# Patient Record
Sex: Female | Born: 2000 | Race: White | Hispanic: No | Marital: Single | Smoking: Former smoker
Health system: Southern US, Community
[De-identification: ages and names within clinical notes are randomized; demographics above are authoritative.]

## PROBLEM LIST (undated history)

## (undated) DIAGNOSIS — T1491XA Suicide attempt, initial encounter: Secondary | ICD-10-CM

## (undated) DIAGNOSIS — E119 Type 2 diabetes mellitus without complications: Secondary | ICD-10-CM

## (undated) DIAGNOSIS — R079 Chest pain, unspecified: Secondary | ICD-10-CM

## (undated) DIAGNOSIS — F419 Anxiety disorder, unspecified: Secondary | ICD-10-CM

## (undated) DIAGNOSIS — F32A Depression, unspecified: Secondary | ICD-10-CM

## (undated) DIAGNOSIS — R000 Tachycardia, unspecified: Secondary | ICD-10-CM

## (undated) DIAGNOSIS — F909 Attention-deficit hyperactivity disorder, unspecified type: Secondary | ICD-10-CM

## (undated) DIAGNOSIS — J45909 Unspecified asthma, uncomplicated: Secondary | ICD-10-CM

## (undated) DIAGNOSIS — I10 Essential (primary) hypertension: Secondary | ICD-10-CM

## (undated) DIAGNOSIS — Z6841 Body Mass Index (BMI) 40.0 and over, adult: Secondary | ICD-10-CM

## (undated) DIAGNOSIS — R071 Chest pain on breathing: Secondary | ICD-10-CM

## (undated) DIAGNOSIS — L732 Hidradenitis suppurativa: Secondary | ICD-10-CM

## (undated) DIAGNOSIS — F329 Major depressive disorder, single episode, unspecified: Secondary | ICD-10-CM

## (undated) DIAGNOSIS — J309 Allergic rhinitis, unspecified: Secondary | ICD-10-CM

## (undated) HISTORY — DX: Anxiety disorder, unspecified: F41.9

## (undated) HISTORY — DX: Body Mass Index (BMI) 40.0 and over, adult: Z684

## (undated) HISTORY — DX: Chest pain on breathing: R07.1

## (undated) HISTORY — PX: TONSILLECTOMY: SUR1361

## (undated) HISTORY — DX: Chest pain, unspecified: R07.9

## (undated) HISTORY — DX: Essential (primary) hypertension: I10

## (undated) HISTORY — DX: Allergic rhinitis, unspecified: J30.9

## (undated) HISTORY — DX: Tachycardia, unspecified: R00.0

---
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
## 2021-03-06 NOTE — Progress Notes (Signed)
This encounter was created in error - please disregard.

## 2021-03-12 ENCOUNTER — Encounter: Payer: Self-pay | Admitting: Emergency Medicine

## 2021-03-12 ENCOUNTER — Ambulatory Visit
Admission: EM | Admit: 2021-03-12 | Discharge: 2021-03-12 | Disposition: A | Discharge status: Home / Self Care | Payer: Medicaid Other | Attending: Internal Medicine | Admitting: Internal Medicine

## 2021-03-12 DIAGNOSIS — L739 Follicular disorder, unspecified: Secondary | ICD-10-CM | POA: Diagnosis not present

## 2021-03-12 MED ORDER — DOXYCYCLINE HYCLATE 100 MG PO CAPS: 100.0000 mg | ORAL_CAPSULE | Freq: Two times a day (BID) | ORAL | 0 refills | Status: DC

## 2021-03-12 NOTE — ED Provider Notes (Signed)
[facility]    CSN: [id number] Arrival date & time: 03/12/21  1444      History   Chief Complaint Chief Complaint  Patient presents with   Facial Bump    HPI Chelsea Wade is a 20 y.o. female.   Patient presents with bump to left side of face near her mouth that has been present for 3 to 4 days.  Patient has tried to "pop" bump with no access.  Patient denies any drainage from the bump.  Denies any fevers.  Denies any insects biting face.  Patient reports that this has not happened to face before but she does have similar lesions that are present throughout her body at times.    Past Medical History:  Diagnosis Date   ADHD (attention deficit hyperactivity disorder)    Asthma    Depression    Suicide attempt [facility]     Patient Active Problem List   Diagnosis Date Noted   MDD (major depressive disorder), severe (HCC) 09/30/2017   MDD (major depressive disorder), recurrent episode, severe (HCC) 11/12/2015   Drug overdose 11/10/2015   Intentional overdose of drug in tablet form (HCC) 11/10/2015   Convulsions/seizures (HCC) 11/10/2015    Past Surgical History:  Procedure Laterality Date   TONSILLECTOMY      OB History   No obstetric history on file.      Home Medications    Prior to Admission medications   Medication Sig Start Date End Date Taking? Authorizing Provider  albuterol (PROVENTIL HFA;VENTOLIN HFA) 108 (90 Base) MCG/ACT inhaler Inhale 2 puffs into the lungs every 4 (four) hours as needed for wheezing or shortness of breath.   Yes [provider]  cetirizine (ZYRTEC) 10 MG chewable tablet Chew 1 tablet (10 mg total) by mouth daily. 12/16/18  Yes Wurst, [place], PA-C  cloNIDine (CATAPRES) 0.2 MG tablet Take 1 tablet (0.2 mg total) by mouth 2 (two) times daily. 12/06/20  Yes Myrlene Broker, MD  Dexmethylphenidate HCl (FOCALIN XR) 30 MG CP24 Take 1 capsule (30 mg total) by mouth in the morning. 12/06/20  Yes Myrlene Broker, MD   Dexmethylphenidate HCl (FOCALIN XR) 30 MG CP24 Take 1 capsule (30 mg total) by mouth every morning. 12/06/20  Yes Myrlene Broker, MD  Dexmethylphenidate HCl (FOCALIN XR) 30 MG CP24 Take 1 capsule (30 mg total) by mouth every morning. 12/06/20  Yes Myrlene Broker, MD  doxycycline (VIBRAMYCIN) 100 MG capsule Take 1 capsule (100 mg total) by mouth 2 (two) times daily. 03/12/21  Yes Lance Muss, FNP  FLUoxetine (PROZAC) 20 MG capsule Take 1 capsule (20 mg total) by mouth daily. 12/06/20 12/06/21 Yes Myrlene Broker, MD  fluticasone [facility]) 50 MCG/ACT nasal spray Place 2 sprays into both nostrils daily. 12/16/18  Yes Wurst, [place], PA-C  hydrOXYzine (VISTARIL) 25 MG capsule Take 1 capsule (25 mg total) by mouth daily as needed for anxiety. 05/12/20  Yes Hisada, Barbee Cough, MD  norethindrone (MICRONOR) 0.35 MG tablet Take by mouth. 07/04/20  Yes [provider]  ondansetron (ZOFRAN ODT) 4 MG disintegrating tablet Take 1 tablet (4 mg total) by mouth every 8 (eight) hours as needed for nausea or vomiting. 12/11/20  Yes Lamptey, Britta Mccreedy, MD  FLOVENT HFA 110 MCG/ACT inhaler INHALE 2 PUFFS TWICE DAILY FOR CONTROL OF COUGH 08/23/17   [provider]  predniSONE (DELTASONE) 20 MG tablet Take 2 tablets (40 mg total) by mouth daily. 12/20/20   Mardella Layman, MD  promethazine-dextromethorphan (  PROMETHAZINE-DM) 6.25-15 MG/5ML syrup Take 5 mLs by mouth 4 (four) times daily as needed for cough. 12/11/20   Lamptey, Britta Mccreedy, MD    Family History Family History  Problem Relation Age of Onset   Depression Mother    Bipolar disorder Father    Mental illness Father    Alcohol abuse Father    Schizophrenia Maternal Grandfather    Alcohol abuse Maternal Grandfather     Social History Social History   Tobacco Use   Smoking status: Every Day    Packs/day: 0.25    Types: Cigarettes   Smokeless tobacco: Never  Vaping Use   Vaping Use: Every day  Substance Use Topics   Alcohol use: Yes     Alcohol/week: 3.0 standard drinks    Types: 3 Shots of liquor per week    Comment: occ   Drug use: Yes    Frequency: 3.0 times per week    Types: Marijuana     Allergies   Patient has no known allergies.   Review of Systems Review of Systems Per HPI  Physical Exam Triage Vital Signs ED Triage Vitals  Enc Vitals Group     BP 03/12/21 1454 132/86     Pulse Rate 03/12/21 1454 (!) 105     Resp 03/12/21 1454 20     Temp 03/12/21 1454 98.2 F (36.8 C)     Temp Source 03/12/21 1454 Oral     SpO2 03/12/21 1454 99 %     Weight 03/12/21 1456 250 lb (113.4 kg)     Height 03/12/21 1456 5\' 3"  (1.6 m)     Head Circumference --      Peak Flow --      Pain Score 03/12/21 1456 8     Pain Loc --      Pain Edu? --      Excl. in GC? --    No data found.  Updated Vital Signs BP 132/86 (BP Location: Left Arm)   Pulse (!) 105   Temp 98.2 F (36.8 C) (Oral)   Resp 20   Ht 5\' 3"  (1.6 m)   Wt 250 lb (113.4 kg)   SpO2 99%   BMI 44.29 kg/m   Visual Acuity Right Eye Distance:   Left Eye Distance:   Bilateral Distance:    Right Eye Near:   Left Eye Near:    Bilateral Near:     Physical Exam Constitutional:      Appearance: Normal appearance.  HENT:     Head: Normocephalic and atraumatic.     Mouth/Throat:     Mouth: Mucous membranes are moist. No oral lesions.     Dentition: Normal dentition. No dental tenderness, gingival swelling or dental abscesses.  Eyes:     Extraocular Movements: Extraocular movements intact.     Conjunctiva/sclera: Conjunctivae normal.  Pulmonary:     Effort: Pulmonary effort is normal.  Skin:    General: Skin is warm and dry.     Findings: Abscess present.     Comments: Approximately 1.5 mm indurated abscess present directly above left upper lip.  Induration spreads approximately 1 mm to each side of abscess.  No drainage noted on exam.  Oral mucosa normal.  Neurological:     General: No focal deficit present.     Mental Status: She is alert  and oriented to person, place, and time. Mental status is at baseline.  Psychiatric:        Mood and Affect: Mood  normal.        Behavior: Behavior normal.        Thought Content: Thought content normal.        Judgment: Judgment normal.     UC Treatments / Results  Labs (all labs ordered are listed, but only abnormal results are displayed) Labs Reviewed - No data to display  EKG   Radiology No results found.  Procedures Procedures (including critical care time)  Medications Ordered in UC Medications - No data to display  Initial Impression / Assessment and Plan / UC Course  I have reviewed the triage vital signs and the nursing notes.  Pertinent labs & imaging results that were available during my care of the patient were reviewed by me and considered in my medical decision making (see chart for details).     Will treat facial lesion with doxycycline x10 days.  Lesion appears to be folliculitis of upper lip.  No I&D needed at this time.  Advised patient to use warm compresses.  Patient to follow-up if no improvement or if lesion worsens.Discussed strict return precautions. Patient verbalized understanding and is agreeable with plan.  Final Clinical Impressions(s) / UC Diagnoses   Final diagnoses:  Folliculitis     Discharge Instructions      You have been prescribed doxycycline antibiotic to help treat bump on face.  Please also use warm compresses.  Follow-up with primary care physician if it does not resolve.     ED Prescriptions     Medication Sig Dispense Auth. Provider   doxycycline (VIBRAMYCIN) 100 MG capsule Take 1 capsule (100 mg total) by mouth 2 (two) times daily. 20 capsule Lance Muss, FNP      PDMP not reviewed this encounter.   Lance Muss, FNP 03/12/21 [phone]

## 2021-03-12 NOTE — ED Triage Notes (Signed)
Patient c/o a bump on left side of face for 3-4 days.  The bump is near patient's lip.  Patient has tried to "pop" the bump which has irritated the bump more.

## 2021-03-12 NOTE — Discharge Instructions (Signed)
You have been prescribed doxycycline antibiotic to help treat bump on face.  Please also use warm compresses.  Follow-up with primary care physician if it does not resolve.

## 2021-03-21 ENCOUNTER — Other Ambulatory Visit: Payer: Self-pay

## 2021-03-21 ENCOUNTER — Encounter (HOSPITAL_COMMUNITY): Payer: Self-pay | Admitting: Psychiatry

## 2021-03-21 ENCOUNTER — Telehealth (INDEPENDENT_AMBULATORY_CARE_PROVIDER_SITE_OTHER): Payer: Medicaid Other | Admitting: Psychiatry

## 2021-03-21 DIAGNOSIS — F331 Major depressive disorder, recurrent, moderate: Secondary | ICD-10-CM

## 2021-03-21 MED ORDER — CLONAZEPAM 0.5 MG PO TABS: 0.5000 mg | ORAL_TABLET | Freq: Every day | ORAL | 2 refills | Status: DC | PRN

## 2021-03-21 MED ORDER — FLUOXETINE HCL 20 MG PO CAPS: 20.0000 mg | ORAL_CAPSULE | Freq: Every day | ORAL | 2 refills | Status: DC

## 2021-03-21 NOTE — Progress Notes (Signed)
Virtual Visit via Telephone Note  I connected with Chelsea Wade on 03/21/21 at 10:40 AM EDT by telephone and verified that I am speaking with the correct person using two identifiers.  Location: Patient: home Provider: home office   I discussed the limitations, risks, security and privacy concerns of performing an evaluation and management service by telephone and the availability of in person appointments. I also discussed with the patient that there may be a patient responsible charge related to this service. The patient expressed understanding and agreed to proceed.      I discussed the assessment and treatment plan with the patient. The patient was provided an opportunity to ask questions and all were answered. The patient agreed with the plan and demonstrated an understanding of the instructions.   The patient was advised to call back or seek an in-person evaluation if the symptoms worsen or if the condition fails to improve as anticipated.  I provided  minutes of non-face-to-face time during this encounter.   Diannia Ruder, MD  [facility] MD/PA/NP OP Progress Note  03/21/2021 11:03 AM Chelsea Wade  MRN:  [id number]  Chief Complaint:  Chief Complaint   Anxiety; Depression; ADD; Follow-up    HPI: This patient is a 20 year old white female who lives with her boyfriend and boyfriend's mother in [place].  She is currently working in a daycare center.  The patient returns for follow-up after 3 months regarding her depression anxiety and ADHD.  She states that she was off her medications for about a month because she "could not find them."  She just got back on the fluoxetine last week.  She no longer feels that she needs medicines for ADHD or sleep.  She does state that her anxiety has gotten much worse lately.  Her job can be very stressful and she is constantly worried about the children getting hurt that are in her care.  Also her mother is having major surgery as a complication  of Crohn's disease.  She has to have her entire stomach removed next week.  The patient is very worried about this and thinks she exacerbated her mom's illness by moving out last year.  I reassured her that Crohn's can be a very bad disease and this certainly was not influenced by her move.  The patient denies being suicidal or significantly depressed.  I suggested we add a low-dose of clonazepam given her anxiety and that she also get into therapy and she agrees Visit Diagnosis:    ICD-10-CM   1. Moderate episode of recurrent major depressive disorder (HCC)  F33.1       Past Psychiatric History: The patient has had 2 previous psychiatric hospitalizations as well as medication trials of Prozac Lexapro Strattera and Focalin XR  Past Medical History:  Past Medical History:  Diagnosis Date   ADHD (attention deficit hyperactivity disorder)    Asthma    Depression    Suicide attempt [facility])     Past Surgical History:  Procedure Laterality Date   TONSILLECTOMY      Family Psychiatric History: See below  Family History:  Family History  Problem Relation Age of Onset   Depression Mother    Bipolar disorder Father    Mental illness Father    Alcohol abuse Father    Schizophrenia Maternal Grandfather    Alcohol abuse Maternal Grandfather     Social History:  Social History   Socioeconomic History   Marital status: Single    Spouse name: Not on file  Number of children: Not on file   Years of education: Not on file   Highest education level: Not on file  Occupational History   Not on file  Tobacco Use   Smoking status: Every Day    Packs/day: 0.25    Types: Cigarettes   Smokeless tobacco: Never  Vaping Use   Vaping Use: Every day  Substance and Sexual Activity   Alcohol use: Yes    Alcohol/week: 3.0 standard drinks    Types: 3 Shots of liquor per week    Comment: occ   Drug use: Yes    Frequency: 3.0 times per week    Types: Marijuana   Sexual activity: Not  Currently    Birth control/protection: None  Other Topics Concern   Not on file  Social History Narrative   Lives with mom, step-dad, brother. Step brothers every other weekend.   Social Determinants of Health   Financial Resource Strain: Not on file  Food Insecurity: Not on file  Transportation Needs: Not on file  Physical Activity: Not on file  Stress: Not on file  Social Connections: Not on file    Allergies: No Known Allergies  Metabolic Disorder Labs: Lab Results  Component Value Date   HGBA1C 5.2 10/02/2017   MPG 102.54 10/02/2017   MPG 108 11/14/2015   Lab Results  Component Value Date   PROLACTIN 57.7 (H) 10/02/2017   Lab Results  Component Value Date   CHOL 145 10/02/2017   TRIG 89 10/02/2017   HDL 39 (L) 10/02/2017   CHOLHDL 3.7 10/02/2017   VLDL 18 10/02/2017   LDLCALC 88 10/02/2017   LDLCALC 53 11/14/2015   Lab Results  Component Value Date   TSH 4.562 10/02/2017   TSH 3.926 11/14/2015    Therapeutic Level Labs: No results found for: LITHIUM No results found for: VALPROATE No components found for:  CBMZ  Current Medications: Current Outpatient Medications  Medication Sig Dispense Refill   clonazePAM (KLONOPIN) 0.5 MG tablet Take 1 tablet (0.5 mg total) by mouth daily as needed for anxiety. 30 tablet 2   doxycycline (VIBRAMYCIN) 100 MG capsule Take 1 capsule (100 mg total) by mouth 2 (two) times daily. 20 capsule 0   FLOVENT HFA 110 MCG/ACT inhaler INHALE 2 PUFFS TWICE DAILY FOR CONTROL OF COUGH  2   FLUoxetine (PROZAC) 20 MG capsule Take 1 capsule (20 mg total) by mouth daily. 30 capsule 2   fluticasone (FLONASE) 50 MCG/ACT nasal spray Place 2 sprays into both nostrils daily. 16 g 0   norethindrone (MICRONOR) 0.35 MG tablet Take by mouth.     ondansetron (ZOFRAN ODT) 4 MG disintegrating tablet Take 1 tablet (4 mg total) by mouth every 8 (eight) hours as needed for nausea or vomiting. 20 tablet 0   No current facility-administered medications  for this visit.     Musculoskeletal: Strength & Muscle Tone: na Gait & Station: na Patient leans: N/A  Psychiatric Specialty Exam: Review of Systems  Gastrointestinal:  Positive for nausea.  Psychiatric/Behavioral:  The patient is nervous/anxious.   All other systems reviewed and are negative.  There were no vitals taken for this visit.There is no height or weight on file to calculate BMI.  General Appearance: NA  Eye Contact:  NA  Speech:  Clear and Coherent  Volume:  Normal  Mood:  Anxious and Dysphoric  Affect:  NA  Thought Process:  Goal Directed  Orientation:  Full (Time, Place, and Person)  Thought Content: Rumination  Suicidal Thoughts:  No  Homicidal Thoughts:  No  Memory:  Immediate;   Good Recent;   Good Remote;   Good  Judgement:  Good  Insight:  Fair  Psychomotor Activity:  Normal  Concentration:  Concentration: Fair and Attention Span: Fair  Recall:  Good  Fund of Knowledge: Good  Language: Good  Akathisia:  No  Handed:  Right  AIMS (if indicated): not done  Assets:  Communication Skills Desire for Improvement Physical Health Resilience Social Support Talents/Skills  ADL's:  Intact  Cognition: WNL  Sleep:  Good   Screenings: AIMS    Flowsheet Row [facility] Admission (Discharged) [facility]  AIMS Total Score 0 0      AUDIT    Flowsheet Row [facility]) [facility] Ad[facility] (Discharged[facility]  Alcohol Use Disorder Identification Test Final Score (AUDIT) 6 0      PHQ2-9    Flowsheet Row [facility] Video Visit from 12/06/2020 [facility]  PHQ-2 Total Score 2 1  PHQ-9  Total Score 6 --      Flowsheet Row Video Visit from 03/21/2021 [facility] ED from 03/12/2021 in [facility]  ED from 12/20/2020 in [facility] RISK CATEGORY No Risk No Risk Error: Question 6 not populated        Assessment and Plan: 20 year old female with a history of depression anxiety ADD and insomnia.  She feels that she does not need medications for insomnia or ADD right now.  However she is quite anxious so we will add clonazepam 0.5 mg daily for anxiety.  The Vistaril made her very drowsy.  She will continue Prozac 20 mg daily for depression and anxiety.  We will schedule counseling for her and she will return to see me in 2 months   Diannia Ruder, MD 03/21/2021, 11:03 AM

## 2021-04-02 ENCOUNTER — Ambulatory Visit
Admission: EM | Admit: 2021-04-02 | Discharge: 2021-04-02 | Disposition: A | Discharge status: Home / Self Care | Payer: Medicaid Other | Attending: Physician Assistant | Admitting: Physician Assistant

## 2021-04-02 ENCOUNTER — Encounter: Payer: Self-pay | Admitting: Emergency Medicine

## 2021-04-02 ENCOUNTER — Other Ambulatory Visit: Payer: Self-pay

## 2021-04-02 DIAGNOSIS — R091 Pleurisy: Secondary | ICD-10-CM

## 2021-04-02 NOTE — ED Provider Notes (Signed)
[facility]    CSN: [id number] Arrival date & time: 04/02/21  1821      History   Chief Complaint Chief Complaint  Patient presents with   Pleurisy    HPI Chelsea Wade is a 20 y.o. female.   Patient here today for evaluation of pleurisy that started this morning.  She reports that she had pain with deep inspiration that radiated to the left side of her back and shoulder.  She reports this cleared around 5:00 this evening.  She denies any symptoms at that time.  She does have history of anxiety, but states she tried taking one of her Klonopin without significant relief of symptoms.  The history is provided by the patient.   Past Medical History:  Diagnosis Date   ADHD (attention deficit hyperactivity disorder)    Asthma    Depression    Suicide attempt [facility])     Patient Active Problem List   Diagnosis Date Noted   MDD (major depressive disorder), severe (HCC) 09/30/2017   MDD (major depressive disorder), recurrent episode, severe (HCC) 11/12/2015   Drug overdose 11/10/2015   Intentional overdose of drug in tablet form (HCC) 11/10/2015   Convulsions/seizures (HCC) 11/10/2015    Past Surgical History:  Procedure Laterality Date   TONSILLECTOMY      OB History   No obstetric history on file.      Home Medications    Prior to Admission medications   Medication Sig Start Date End Date Taking? Authorizing Provider  clonazePAM (KLONOPIN) 0.5 MG tablet Take 1 tablet (0.5 mg total) by mouth daily as needed for anxiety. 03/21/21 03/21/22  Myrlene Broker, MD  doxycycline (VIBRAMYCIN) 100 MG capsule Take 1 capsule (100 mg total) by mouth 2 (two) times daily. 03/12/21   Lance Muss, FNP  FLOVENT HFA 110 MCG/ACT inhaler INHALE 2 PUFFS TWICE DAILY FOR CONTROL OF COUGH 08/23/17   [provider]  FLUoxetine (PROZAC) 20 MG capsule Take 1 capsule (20 mg total) by mouth daily. 03/21/21 03/21/22  Myrlene Broker, MD  fluticasone (FLONASE) 50 MCG/ACT nasal  spray Place 2 sprays into both nostrils daily. 12/16/18   Wurst, [place], PA-C  norethindrone (MICRONOR) 0.35 MG tablet Take by mouth. 07/04/20   [provider]  ondansetron (ZOFRAN ODT) 4 MG disintegrating tablet Take 1 tablet (4 mg total) by mouth every 8 (eight) hours as needed for nausea or vomiting. 12/11/20   Lamptey, Britta Mccreedy, MD    Family History Family History  Problem Relation Age of Onset   Depression Mother    Bipolar disorder Father    Mental illness Father    Alcohol abuse Father    Schizophrenia Maternal Grandfather    Alcohol abuse Maternal Grandfather     Social History Social History   Tobacco Use   Smoking status: Every Day    Packs/day: 0.25    Types: Cigarettes   Smokeless tobacco: Never  Vaping Use   Vaping Use: Every day  Substance Use Topics   Alcohol use: Yes    Alcohol/week: 3.0 standard drinks    Types: 3 Shots of liquor per week    Comment: occ   Drug use: Yes    Frequency: 3.0 times per week    Types: Marijuana     Allergies   Patient has no known allergies.   Review of Systems Review of Systems  Constitutional:  Negative for chills and fever.  Eyes:  Negative for discharge and redness.  Respiratory:  Negative for shortness of breath.   Cardiovascular:  Positive for chest pain.    Physical Exam Triage Vital Signs ED Triage Vitals  Enc Vitals Group     BP 04/02/21 1856 (!) 157/100     Pulse Rate 04/02/21 1856 (!) 110     Resp 04/02/21 1856 16     Temp 04/02/21 1856 98.8 F (37.1 C)     Temp Source 04/02/21 1856 Oral     SpO2 04/02/21 1856 98 %     Weight --      Height --      Head Circumference --      Peak Flow --      Pain Score 04/02/21 1857 0     Pain Loc --      Pain Edu? --      Excl. in GC? --    No data found.  Updated Vital Signs BP (!) 157/100 (BP Location: Left Arm)   Pulse (!) 110   Temp 98.8 F (37.1 C) (Oral)   Resp 16   SpO2 98%   Physical Exam Vitals and nursing note reviewed.   Constitutional:      General: She is not in acute distress.    Appearance: Normal appearance. She is not ill-appearing.  HENT:     Head: Normocephalic and atraumatic.  Eyes:     Conjunctiva/sclera: Conjunctivae normal.  Cardiovascular:     Rate and Rhythm: Normal rate and regular rhythm.     Heart sounds: Normal heart sounds. No murmur heard. Pulmonary:     Effort: Pulmonary effort is normal. No respiratory distress.     Breath sounds: Normal breath sounds. No wheezing, rhonchi or rales.  Neurological:     Mental Status: She is alert.  Psychiatric:        Mood and Affect: Mood normal.        Behavior: Behavior normal.        Thought Content: Thought content normal.     UC Treatments / Results  Labs (all labs ordered are listed, but only abnormal results are displayed) Labs Reviewed - No data to display  EKG- NSR, mild tachycardia   Radiology No results found.  Procedures Procedures (including critical care time)  Medications Ordered in UC Medications - No data to display  Initial Impression / Assessment and Plan / UC Course  I have reviewed the triage vital signs and the nursing notes.  Pertinent labs & imaging results that were available during my care of the patient were reviewed by me and considered in my medical decision making (see chart for details).  Reassured patient that EKG looked normal.  Very reassuring that all symptoms have cleared.  Recommended follow-up with her PCP if symptoms return.  Encouraged evaluation in the ED with any worsening symptoms.  Final Clinical Impressions(s) / UC Diagnoses   Final diagnoses:  Pleurisy   Discharge Instructions   None    ED Prescriptions   None    PDMP not reviewed this encounter.   Tomi Bamberger, PA-C 04/02/21 1958

## 2021-04-02 NOTE — ED Triage Notes (Signed)
Chest pain that occurred only on inspiration and with positional changes started this morning. Went away mid afternoon and patient feels better but is here to be examined.

## 2021-04-09 ENCOUNTER — Ambulatory Visit (HOSPITAL_COMMUNITY): Payer: Medicaid Other

## 2021-04-11 ENCOUNTER — Ambulatory Visit: Payer: Medicaid Other

## 2021-05-24 DIAGNOSIS — Z419 Encounter for procedure for purposes other than remedying health state, unspecified: Secondary | ICD-10-CM | POA: Diagnosis not present

## 2021-05-28 ENCOUNTER — Encounter (HOSPITAL_COMMUNITY): Payer: Self-pay

## 2021-05-28 ENCOUNTER — Other Ambulatory Visit: Payer: Self-pay

## 2021-05-28 ENCOUNTER — Ambulatory Visit (HOSPITAL_COMMUNITY)
Admission: RE | Admit: 2021-05-28 | Discharge: 2021-05-28 | Disposition: A | Discharge status: Home / Self Care | Payer: Medicaid Other | Source: Ambulatory Visit | Attending: Emergency Medicine | Admitting: Emergency Medicine

## 2021-05-28 VITALS — HR 120 | Temp 98.6°F | Resp 18

## 2021-05-28 DIAGNOSIS — J069 Acute upper respiratory infection, unspecified: Secondary | ICD-10-CM | POA: Insufficient documentation

## 2021-05-28 DIAGNOSIS — J45909 Unspecified asthma, uncomplicated: Secondary | ICD-10-CM | POA: Diagnosis not present

## 2021-05-28 DIAGNOSIS — Z20822 Contact with and (suspected) exposure to covid-19: Secondary | ICD-10-CM | POA: Diagnosis not present

## 2021-05-28 LAB — POC INFLUENZA A AND B ANTIGEN (URGENT CARE ONLY)
INFLUENZA A ANTIGEN, POC: NEGATIVE
INFLUENZA B ANTIGEN, POC: NEGATIVE

## 2021-05-28 MED ORDER — ALBUTEROL SULFATE HFA 108 (90 BASE) MCG/ACT IN AERS: 2.0000 | INHALATION_SPRAY | RESPIRATORY_TRACT | 0 refills | Status: DC | PRN

## 2021-05-28 MED ORDER — BENZONATATE 100 MG PO CAPS: 100.0000 mg | ORAL_CAPSULE | Freq: Three times a day (TID) | ORAL | 0 refills | Status: DC

## 2021-05-28 MED ORDER — PROMETHAZINE-DM 6.25-15 MG/5ML PO SYRP: 5.0000 mL | ORAL_SOLUTION | Freq: Four times a day (QID) | ORAL | 0 refills | Status: DC | PRN

## 2021-05-28 NOTE — ED Provider Notes (Signed)
[facility]    CSN: [id number] Arrival date & time: 05/28/21  1202      History   Chief Complaint Chief Complaint  Patient presents with   Cough   Fever    Congestion x 2-3 days.    HPI Chelsea Wade is a 20 y.o. female.    Patient presents with chills, nasal congestion, rhinorrhea, sore throat, nonproductive cough, increased shortness of breath and wheezing, generalized abdominal pain for 3 days.  Tolerating food and liquids.  Known sick contacts, works at daycare.Marland Kitchen History of asthma. Has not attempted treatment.  Denies fever, ear pain, headaches, nausea, vomiting, diarrhea.    Past Medical History:  Diagnosis Date   ADHD (attention deficit hyperactivity disorder)    Asthma    Depression    Suicide attempt [facility])     Patient Active Problem List   Diagnosis Date Noted   MDD (major depressive disorder), severe (HCC) 09/30/2017   MDD (major depressive disorder), recurrent episode, severe (HCC) 11/12/2015   Drug overdose 11/10/2015   Intentional overdose of drug in tablet form (HCC) 11/10/2015   Convulsions/seizures (HCC) 11/10/2015    Past Surgical History:  Procedure Laterality Date   TONSILLECTOMY      OB History   No obstetric history on file.      Home Medications    Prior to Admission medications   Medication Sig Start Date End Date Taking? Authorizing Provider  clonazePAM (KLONOPIN) 0.5 MG tablet Take 1 tablet (0.5 mg total) by mouth daily as needed for anxiety. 03/21/21 03/21/22  Myrlene Broker, MD  doxycycline (VIBRAMYCIN) 100 MG capsule Take 1 capsule (100 mg total) by mouth 2 (two) times daily. 03/12/21   Mound, Acie Fredrickson, FNP  FLOVENT HFA 110 MCG/ACT inhaler INHALE 2 PUFFS TWICE DAILY FOR CONTROL OF COUGH 08/23/17   [provider]  FLUoxetine (PROZAC) 20 MG capsule Take 1 capsule (20 mg total) by mouth daily. 03/21/21 03/21/22  Myrlene Broker, MD  fluticasone (FLONASE) 50 MCG/ACT nasal spray Place 2 sprays into both nostrils  daily. 12/16/18   Wurst, [place], PA-C  norethindrone (MICRONOR) 0.35 MG tablet Take by mouth. 07/04/20   [provider]  ondansetron (ZOFRAN ODT) 4 MG disintegrating tablet Take 1 tablet (4 mg total) by mouth every 8 (eight) hours as needed for nausea or vomiting. 12/11/20   Lamptey, Britta Mccreedy, MD    Family History Family History  Problem Relation Age of Onset   Depression Mother    Bipolar disorder Father    Mental illness Father    Alcohol abuse Father    Schizophrenia Maternal Grandfather    Alcohol abuse Maternal Grandfather     Social History Social History   Tobacco Use   Smoking status: Every Day    Packs/day: 0.25    Types: Cigarettes   Smokeless tobacco: Never  Vaping Use   Vaping Use: Every day  Substance Use Topics   Alcohol use: Yes    Alcohol/week: 3.0 standard drinks    Types: 3 Shots of liquor per week    Comment: occ   Drug use: Yes    Frequency: 3.0 times per week    Types: Marijuana     Allergies   Patient has no known allergies.   Review of Systems Review of Systems  Constitutional:  Positive for chills. Negative for activity change, appetite change, diaphoresis, fatigue, fever and unexpected weight change.  HENT:  Positive for congestion, rhinorrhea and sore throat. Negative for dental problem,  drooling, ear discharge, ear pain, facial swelling, hearing loss, mouth sores, nosebleeds, postnasal drip, sinus pressure, sinus pain, sneezing, tinnitus, trouble swallowing and voice change.   Respiratory:  Positive for cough, shortness of breath and wheezing. Negative for apnea, choking, chest tightness and stridor.   Cardiovascular: Negative.   Gastrointestinal:  Positive for abdominal pain and nausea. Negative for abdominal distention, anal bleeding, blood in stool, constipation, diarrhea, rectal pain and vomiting.  Skin: Negative.   Neurological: Negative.     Physical Exam Triage Vital Signs ED Triage Vitals  Enc Vitals Group     BP --       Pulse Rate 05/28/21 1234 (!) 120     Resp 05/28/21 1228 18     Temp 05/28/21 1228 98.6 F (37 C)     Temp Source 05/28/21 1228 Oral     SpO2 05/28/21 1228 100 %     Weight --      Height --      Head Circumference --      Peak Flow --      Pain Score 05/28/21 1230 0     Pain Loc --      Pain Edu? --      Excl. in GC? --    No data found.  Updated Vital Signs Pulse (!) 120   Temp 98.6 F (37 C) (Oral)   Resp 18   LMP  (LMP Unknown)   SpO2 100%   Visual Acuity Right Eye Distance:   Left Eye Distance:   Bilateral Distance:    Right Eye Near:   Left Eye Near:    Bilateral Near:     Physical Exam Constitutional:      Appearance: Normal appearance.  HENT:     Head: Normocephalic.     Right Ear: Tympanic membrane, ear canal and external ear normal.     Left Ear: Tympanic membrane, ear canal and external ear normal.     Nose: Congestion and rhinorrhea present.     Mouth/Throat:     Mouth: Mucous membranes are moist.     Pharynx: Posterior oropharyngeal erythema present.  Eyes:     Extraocular Movements: Extraocular movements intact.  Cardiovascular:     Rate and Rhythm: Normal rate and regular rhythm.     Pulses: Normal pulses.     Heart sounds: Normal heart sounds.  Pulmonary:     Effort: Pulmonary effort is normal.     Breath sounds: Normal breath sounds.  Musculoskeletal:     Cervical back: Normal range of motion and neck supple.  Skin:    General: Skin is warm and dry.  Neurological:     Mental Status: She is alert and oriented to person, place, and time. Mental status is at baseline.  Psychiatric:        Mood and Affect: Mood normal.        Behavior: Behavior normal.     UC Treatments / Results  Labs (all labs ordered are listed, but only abnormal results are displayed) Labs Reviewed - No data to display  EKG   Radiology No results found.  Procedures Procedures (including critical care time)  Medications Ordered in UC Medications - No  data to display  Initial Impression / Assessment and Plan / UC Course  I have reviewed the triage vital signs and the nursing notes.  Pertinent labs & imaging results that were available during my care of the patient were reviewed by me and considered in my medical  decision making (see chart for details).  Viral URI with cough  1.  COVID and flu test pending, required to return to work, work note given 2.  Albuterol inhaler 108 mcg 2 puffs every 4 hours as needed 3.  Tessalon 100 mg 3 times daily as needed 4.  Promethazine DM 6.25-15 mg/5 mils every 6 hours as needed 5.  Over-the-counter medications for remaining symptom management 6.  Urgent care follow-up as needed  Final Clinical Impressions(s) / UC Diagnoses   Final diagnoses:  None   Discharge Instructions   None    ED Prescriptions   None    PDMP not reviewed this encounter.   Valinda Hoar, NP 05/28/21 1306

## 2021-05-28 NOTE — Discharge Instructions (Signed)
We will contact you if your COVID or flu test is positive.  Please quarantine while you wait for the results.  If your test is negative you may resume normal activities.  If your test is positive please continue to quarantine for at least 5 days from your symptom onset or until you are without a fever for at least 24 hours after the medications.    You can take Tylenol and/or Ibuprofen as needed for fever reduction and pain relief.   For cough: honey 1/2 to 1 teaspoon (you can dilute the honey in water or another fluid).  You can also use guaifenesin and dextromethorphan for cough. You can use a humidifier for chest congestion and cough.  If you don't have a humidifier, you can sit in the bathroom with the hot shower running.      For sore throat: try warm salt water gargles, cepacol lozenges, throat spray, warm tea or water with lemon/honey, popsicles or ice, or OTC cold relief medicine for throat discomfort.   For congestion: take a daily anti-histamine like Zyrtec, Claritin, and a oral decongestant, such as pseudoephedrine.  You can also use Flonase 1-2 sprays in each nostril daily.   It is important to stay hydrated: drink plenty of fluids (water, gatorade/powerade/pedialyte, juices, or teas) to keep your throat moisturized and help further relieve irritation/discomfort.

## 2021-05-28 NOTE — ED Triage Notes (Signed)
Pt presents to the office for coughing and congestion x 2-3 days.

## 2021-05-29 LAB — SARS CORONAVIRUS 2 (TAT 6-24 HRS): SARS Coronavirus 2: NEGATIVE

## 2021-06-04 ENCOUNTER — Encounter (HOSPITAL_COMMUNITY): Payer: Self-pay | Admitting: Psychiatry

## 2021-06-04 ENCOUNTER — Other Ambulatory Visit: Payer: Self-pay

## 2021-06-04 ENCOUNTER — Telehealth (INDEPENDENT_AMBULATORY_CARE_PROVIDER_SITE_OTHER): Payer: Medicaid Other | Admitting: Psychiatry

## 2021-06-04 DIAGNOSIS — F331 Major depressive disorder, recurrent, moderate: Secondary | ICD-10-CM | POA: Diagnosis not present

## 2021-06-04 MED ORDER — FLUOXETINE HCL 20 MG PO CAPS: 20.0000 mg | ORAL_CAPSULE | Freq: Every day | ORAL | 2 refills | Status: DC

## 2021-06-04 MED ORDER — CLONAZEPAM 0.5 MG PO TABS
0.5000 mg | ORAL_TABLET | Freq: Every day | ORAL | 2 refills | Status: DC | PRN
Start: 2021-06-04 — End: 2021-07-02

## 2021-06-04 NOTE — Progress Notes (Signed)
Virtual Visit via Telephone Note  I connected with Chelsea Wade on 06/04/21 at 10:40 AM EST by telephone and verified that I am speaking with the correct person using two identifiers.  Location: Patient: home Provider: home office   I discussed the limitations, risks, security and privacy concerns of performing an evaluation and management service by telephone and the availability of in person appointments. I also discussed with the patient that there may be a patient responsible charge related to this service. The patient expressed understanding and agreed to proceed.      I discussed the assessment and treatment plan with the patient. The patient was provided an opportunity to ask questions and all were answered. The patient agreed with the plan and demonstrated an understanding of the instructions.   The patient was advised to call back or seek an in-person evaluation if the symptoms worsen or if the condition fails to improve as anticipated.  I provided 20 minutes of non-face-to-face time during this encounter.   Levonne Spiller, MD  [facility] MD/PA/NP OP Progress Note  06/04/2021 11:03 AM Chelsea Wade  MRN:  [id number]  Chief Complaint: depression, anxiety HPI: This patient is a 20 year old white female who lives with her boyfriend and boyfriend's mother in [place].  She is currently unemployed.  The patient returns for follow-up after 3 months regarding her depression and anxiety.  She states that everything is gotten worse because her mother has been very ill.  On November 17 her mother had to be hospitalized at [facility] because she went into septic shock.  This was after she had surgery for her Crohn's disease.  The mother has been in the hospital ever since with a very up-and-down course.  She was just recently moved to a stepdown unit from the ICU.  The patient states that this is "turned my life into a roller coaster."  She is gotten more depressed and very worried  about her mother who she claims is like her best friend.  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  The patient admits she has not been compliant with the Prozac but just started taking it a few days ago.  I explained this is what need to get into her system.  She also admits she is having significant anxiety but is scared of the clonazepam.  I urged her to use it as needed.  She has numerous symptoms of depression which I think are primarily secondary to all the stress.  She is willing to talk to a counselor.  She denies suicidal ideation. Visit Diagnosis:    ICD-10-CM   1. Moderate episode of recurrent major depressive disorder (HCC)  F33.1       Past Psychiatric History: The patient has had 2 previous psychiatric hospitalizations and as well as medication trials of Prozac Lexapro Strattera and Focalin XR  Past Medical History:  Past Medical History:  Diagnosis Date   ADHD (attention deficit hyperactivity disorder)    Asthma    Depression    Suicide attempt [facility])     Past Surgical History:  Procedure Laterality Date   TONSILLECTOMY      Family Psychiatric History: see below  Family History:  Family History  Problem Relation Age of Onset   Depression Mother    Bipolar disorder Father    Mental illness Father    Alcohol abuse Father    Schizophrenia Maternal Grandfather  Alcohol abuse Maternal Grandfather     Social History:  Social History   Socioeconomic History   Marital status: Single    Spouse name: Not on file   Number of children: Not on file   Years of education: Not on file   Highest education level: Not on file  Occupational History   Not on file  Tobacco Use   Smoking status: Every Day    Packs/day: 0.25    Types: Cigarettes   Smokeless tobacco: Never  Vaping Use   Vaping Use: Every day  Substance and Sexual Activity   Alcohol use: Yes    Alcohol/week: 3.0 standard drinks     Types: 3 Shots of liquor per week    Comment: occ   Drug use: Yes    Frequency: 3.0 times per week    Types: Marijuana   Sexual activity: Not Currently    Birth control/protection: None  Other Topics Concern   Not on file  Social History Narrative   Lives with mom, step-dad, brother. Step brothers every other weekend.   Social Determinants of Health   Financial Resource Strain: Not on file  Food Insecurity: Not on file  Transportation Needs: Not on file  Physical Activity: Not on file  Stress: Not on file  Social Connections: Not on file    Allergies: No Known Allergies  Metabolic Disorder Labs: Lab Results  Component Value Date   HGBA1C 5.2 10/02/2017   MPG 102.54 10/02/2017   MPG 108 11/14/2015   Lab Results  Component Value Date   PROLACTIN 57.7 (H) 10/02/2017   Lab Results  Component Value Date   CHOL 145 10/02/2017   TRIG 89 10/02/2017   HDL 39 (L) 10/02/2017   CHOLHDL 3.7 10/02/2017   VLDL 18 10/02/2017   LDLCALC 88 10/02/2017   LDLCALC 53 11/14/2015   Lab Results  Component Value Date   TSH 4.562 10/02/2017   TSH 3.926 11/14/2015    Therapeutic Level Labs: No results found for: LITHIUM No results found for: VALPROATE No components found for:  CBMZ  Current Medications: Current Outpatient Medications  Medication Sig Dispense Refill   albuterol (VENTOLIN HFA) 108 (90 Base) MCG/ACT inhaler Inhale 2 puffs into the lungs every 4 (four) hours as needed for wheezing or shortness of breath. 18 g 0   benzonatate (TESSALON) 100 MG capsule Take 1 capsule (100 mg total) by mouth every 8 (eight) hours. 21 capsule 0   clonazePAM (KLONOPIN) 0.5 MG tablet Take 1 tablet (0.5 mg total) by mouth daily as needed for anxiety. 30 tablet 2   doxycycline (VIBRAMYCIN) 100 MG capsule Take 1 capsule (100 mg total) by mouth 2 (two) times daily. 20 capsule 0   FLOVENT HFA 110 MCG/ACT inhaler INHALE 2 PUFFS TWICE DAILY FOR CONTROL OF COUGH  2   FLUoxetine (PROZAC) 20 MG  capsule Take 1 capsule (20 mg total) by mouth daily. 30 capsule 2   fluticasone (FLONASE) 50 MCG/ACT nasal spray Place 2 sprays into both nostrils daily. 16 g 0   norethindrone (MICRONOR) 0.35 MG tablet Take by mouth.     ondansetron (ZOFRAN ODT) 4 MG disintegrating tablet Take 1 tablet (4 mg total) by mouth every 8 (eight) hours as needed for nausea or vomiting. 20 tablet 0   promethazine-dextromethorphan (PROMETHAZINE-DM) 6.25-15 MG/5ML syrup Take 5 mLs by mouth 4 (four) times daily as needed for cough. 118 mL 0   No current facility-administered medications for this visit.     Musculoskeletal:  Strength & Muscle Tone: na Gait & Station: na Patient leans: N/A  Psychiatric Specialty Exam: Review of Systems  Psychiatric/Behavioral:  Positive for dysphoric mood. The patient is nervous/anxious.   All other systems reviewed and are negative.  There were no vitals taken for this visit.There is no height or weight on file to calculate BMI.  General Appearance: NA  Eye Contact:  NA  Speech:  Clear and Coherent  Volume:  Normal  Mood:  Anxious and Depressed  Affect:  NA  Thought Process:  Goal Directed  Orientation:  Full (Time, Place, and Person)  Thought Content: Rumination   Suicidal Thoughts:  No  Homicidal Thoughts:  No  Memory:  Immediate;   Good Recent;   Good Remote;   Good  Judgement:  Good  Insight:  Fair  Psychomotor Activity:  Decreased  Concentration:  Concentration: Good and Attention Span: Good  Recall:  Good  Fund of Knowledge: Good  Language: Good  Akathisia:  No  Handed:  Right  AIMS (if indicated): not done  Assets:  Communication Skills Desire for Improvement Physical Health Resilience Social Support  ADL's:  Intact  Cognition: WNL  Sleep:  Good   Screenings: AIMS    Flowsheet Row [facility] Ad[facility]) [facility]  AIMS Total Score 0 0      AUDIT    Flowsheet Row [facility]) [facility] Ad[facility] ([facility]) [facility]  Alcohol Use Disorder Identification Test Final Score (AUDIT) 6 0      PHQ2-9    Flowsheet Row [facility] Video Visit from 03/21/2021 [facility] Video Visit from 12/06/2020 [facility]  PHQ-2 Total Score 2 2 1   PHQ-9 Total Score 10 6 --      Flowsheet Row Video Visit from 06[facility]04[facility]2021 [facility] from 05[facility]28/2021 in [facility] ED from 04/02/2021 in [facility]   C-SSRS RISK CATEGORY No Risk No Risk No Risk        Assessment and Plan: Patient is a 20 year old female with a history of depression and anxiety.  Unfortunately she has not been very med compliant.  Her depression is worse due to the stress of her mother's illness.  She now agrees to be compliant with the Prozac 20 mg daily and clonazepam 0.25 2.5 mg twice daily as needed for anxiety.  She will start counseling in our office and return to see me in 4 weeks   12, MD 06/04/2021, 11:03 AM

## 2021-06-05 ENCOUNTER — Encounter (HOSPITAL_COMMUNITY): Payer: Self-pay

## 2021-06-05 ENCOUNTER — Other Ambulatory Visit: Payer: Self-pay

## 2021-06-05 ENCOUNTER — Ambulatory Visit (INDEPENDENT_AMBULATORY_CARE_PROVIDER_SITE_OTHER): Payer: Medicaid Other | Admitting: Clinical

## 2021-06-05 DIAGNOSIS — F1210 Cannabis abuse, uncomplicated: Secondary | ICD-10-CM | POA: Diagnosis not present

## 2021-06-05 DIAGNOSIS — F419 Anxiety disorder, unspecified: Secondary | ICD-10-CM

## 2021-06-05 DIAGNOSIS — F331 Major depressive disorder, recurrent, moderate: Secondary | ICD-10-CM

## 2021-06-05 DIAGNOSIS — F4310 Post-traumatic stress disorder, unspecified: Secondary | ICD-10-CM | POA: Diagnosis not present
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
## 2021-06-05 NOTE — Plan of Care (Signed)
Verbal Consent 

## 2021-06-24 DIAGNOSIS — Z419 Encounter for procedure for purposes other than remedying health state, unspecified: Secondary | ICD-10-CM | POA: Diagnosis not present

## 2021-06-26 ENCOUNTER — Other Ambulatory Visit: Payer: Self-pay

## 2021-06-26 ENCOUNTER — Ambulatory Visit (INDEPENDENT_AMBULATORY_CARE_PROVIDER_SITE_OTHER): Payer: Medicaid Other | Admitting: Clinical

## 2021-06-26 DIAGNOSIS — F331 Major depressive disorder, recurrent, moderate: Secondary | ICD-10-CM

## 2021-06-26 DIAGNOSIS — F419 Anxiety disorder, unspecified: Secondary | ICD-10-CM

## 2021-06-26 DIAGNOSIS — F4310 Post-traumatic stress disorder, unspecified: Secondary | ICD-10-CM

## 2021-06-26 DIAGNOSIS — F1210 Cannabis abuse, uncomplicated: Secondary | ICD-10-CM

## 2021-06-26 NOTE — Progress Notes (Signed)
Virtual Visit via Telephone Note  I connected with Chelsea Wade on 06/26/21 at  8:00 AM EST by telephone and verified that I am speaking with the correct person using two identifiers.  Location: Patient: Home Provider: Office   I discussed the limitations, risks, security and privacy concerns of performing an evaluation and management service by telephone and the availability of in person appointments. I also discussed with the patient that there may be a patient responsible charge related to this service. The patient expressed understanding and agreed to proceed.  THERAPIST PROGRESS NOTE   Session Time: 8:00AM-8:30AM   Participation Level: Active   Behavioral Response: CasualAlertDepressed   Type of Therapy: Individual Therapy   Treatment Goals addressed: Mood and Coping   Interventions: CBT, Motivational Interviewing, Solution Focused and Strength-based   Summary: Chelsea Wade is a 21 y.o. female who presents with Depression. The OPT therapist worked with the patient for her ongoing OPT treatment. The OPT therapist utilized Motivational Interviewing to assist in creating therapeutic repore. The patient in the session was engaged and work in collaboration giving feedback about her triggers and symptoms over the past few weeks. The patient spoke about her Mother getting better and  working still to get coming home from the hospital. The OPT therapist utilized Cognitive Behavioral Therapy through cognitive restructuring as well as worked with the patient on coping strategies to assist in management of mood and being active. The OPT therapist worked with the patient providing support and psycho-education. The OPT therapist worked in the session with the patient on challenging negative thoughts and implementing positive thinking. The patient spoke about her experience for the New Years holiday. The patient overviewed her upcoming health appointments noting she is currently looking to set a  new patient appointment potentially with [facility]. The patient notes having a appointment at the end of February to get her license.   Suicidal/Homicidal: Nowithout intent/plan   Therapist Response: The OPT therapist worked with the patient for the patients scheduled session. The patient was engaged in her session and gave feedback in relation to triggers, symptoms, and behavior responses over the past few weeks. The patient noted that her procrastination has been a trigger and factor in her being able to be consistent and "more adult like". The OPT therapist worked with the patient utilizing an in session Cognitive Behavioral Therapy exercise. The patient was responsive in the session and verbalized, " I have difficulty with staying consistent even with like medicine". The OPT therapist reviewed with the patient  with using her cell phone to set a reminder as well as a pill divider. 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 The patient spoke about the money created from going back to work being helpful in getting caught up with bills.The OPT therapist will continue treatment work with the patient in her next scheduled session.   Plan: Return again in 2/3 weeks.   Diagnosis:      Axis I: Recurrent moderate,major depression with Anxiety, PTSD, Cannabis Use Disorder                          Axis II: No diagnosis   I discussed the assessment and treatment plan with the patient. The patient was provided an opportunity to ask questions and all were answered. The patient agreed with the plan and demonstrated an understanding of the instructions.   The patient was advised to call back or seek an in-person evaluation  if the symptoms worsen or if the condition fails to improve as anticipated.   I provided 30 minutes of non-face-to-face time during this encounter.   Maye Hides, LCSW   06/26/2021

## 2021-07-02 ENCOUNTER — Telehealth (INDEPENDENT_AMBULATORY_CARE_PROVIDER_SITE_OTHER): Payer: Medicaid Other | Admitting: Psychiatry

## 2021-07-02 ENCOUNTER — Other Ambulatory Visit: Payer: Self-pay

## 2021-07-02 ENCOUNTER — Encounter (HOSPITAL_COMMUNITY): Payer: Self-pay | Admitting: Psychiatry

## 2021-07-02 DIAGNOSIS — F419 Anxiety disorder, unspecified: Secondary | ICD-10-CM

## 2021-07-02 DIAGNOSIS — F331 Major depressive disorder, recurrent, moderate: Secondary | ICD-10-CM | POA: Diagnosis not present

## 2021-07-02 MED ORDER — CLONAZEPAM 0.5 MG PO TABS: 0.5000 mg | ORAL_TABLET | Freq: Every day | ORAL | 2 refills | Status: DC | PRN

## 2021-07-02 MED ORDER — FLUOXETINE HCL 20 MG PO CAPS: 20.0000 mg | ORAL_CAPSULE | Freq: Every day | ORAL | 2 refills | Status: DC

## 2021-07-02 NOTE — Progress Notes (Signed)
Virtual Visit via Telephone Note  I connected with Chelsea Wade on 07/02/21 at  8:40 AM EST by telephone and verified that I am speaking with the correct person using two identifiers.  Location: Patient: home Provider: office   I discussed the limitations, risks, security and privacy concerns of performing an evaluation and management service by telephone and the availability of in person appointments. I also discussed with the patient that there may be a patient responsible charge related to this service. The patient expressed understanding and agreed to proceed.       I discussed the assessment and treatment plan with the patient. The patient was provided an opportunity to ask questions and all were answered. The patient agreed with the plan and demonstrated an understanding of the instructions.   The patient was advised to call back or seek an in-person evaluation if the symptoms worsen or if the condition fails to improve as anticipated.  I provided 14 minutes of non-face-to-face time during this encounter.   Diannia Ruder, MD  [facility] MD/PA/NP OP Progress Note  07/02/2021 8:51 AM Chelsea Wade  MRN:  [id number]  Chief Complaint:  Chief Complaint   Anxiety; Depression; Follow-up    HPI: This patient is a 21 year old white female who lives with her boyfriend and boyfriend's mother in [place].  She is back working at a daycare center.  The patient returns for follow-up after 4 weeks.  Last time she was getting very stressed because her mother was very ill and had gone into sepsis.  The mother is still at [facility] but is soon to be released to a rehab facility.  The patient is feeling much more positive about this.  She is trying to be more compliant with taking her Prozac and her mood has gotten much better.  She does take the clonazepam as needed for anxiety.  She denies significant depression anxiety difficulty sleeping.  She is working with a Paramedic and setting  positive goals.  She denies suicidal ideation   Visit Diagnosis:    ICD-10-CM   1. Recurrent moderate major depressive disorder with anxiety (HCC)  F33.1    F41.9       Past Psychiatric History: The patient has had 2 previous psychiatric hospitalizations and as well as medication trials of Prozac Lexapro Strattera and Focalin XR  Past Medical History:  Past Medical History:  Diagnosis Date   ADHD (attention deficit hyperactivity disorder)    Asthma    Depression    Suicide attempt [facility])     Past Surgical History:  Procedure Laterality Date   TONSILLECTOMY      Family Psychiatric History: See below  Family History:  Family History  Problem Relation Age of Onset   Depression Mother    Bipolar disorder Father    Mental illness Father    Alcohol abuse Father    Schizophrenia Maternal Grandfather    Alcohol abuse Maternal Grandfather     Social History:  Social History   Socioeconomic History   Marital status: Single    Spouse name: Not on file   Number of children: Not on file   Years of education: Not on file   Highest education level: Not on file  Occupational History   Not on file  Tobacco Use   Smoking status: Every Day    Packs/day: 0.25    Types: Cigarettes   Smokeless tobacco: Never  Vaping Use   Vaping Use: Every day  Substance and Sexual Activity  Alcohol use: Yes    Alcohol/week: 3.0 standard drinks    Types: 3 Shots of liquor per week    Comment: occ   Drug use: Yes    Frequency: 3.0 times per week    Types: Marijuana   Sexual activity: Not Currently    Birth control/protection: None  Other Topics Concern   Not on file  Social History Narrative   Lives with mom, step-dad, brother. Step brothers every other weekend.   Social Determinants of Health   Financial Resource Strain: Not on file  Food Insecurity: Not on file  Transportation Needs: Not on file  Physical Activity: Not on file  Stress: Not on file  Social Connections: Not on  file    Allergies: No Known Allergies  Metabolic Disorder Labs: Lab Results  Component Value Date   HGBA1C 5.2 10/02/2017   MPG 102.54 10/02/2017   MPG 108 11/14/2015   Lab Results  Component Value Date   PROLACTIN 57.7 (H) 10/02/2017   Lab Results  Component Value Date   CHOL 145 10/02/2017   TRIG 89 10/02/2017   HDL 39 (L) 10/02/2017   CHOLHDL 3.7 10/02/2017   VLDL 18 10/02/2017   LDLCALC 88 10/02/2017   LDLCALC 53 11/14/2015   Lab Results  Component Value Date   TSH 4.562 10/02/2017   TSH 3.926 11/14/2015    Therapeutic Level Labs: No results found for: LITHIUM No results found for: VALPROATE No components found for:  CBMZ  Current Medications: Current Outpatient Medications  Medication Sig Dispense Refill   albuterol (VENTOLIN HFA) 108 (90 Base) MCG/ACT inhaler Inhale 2 puffs into the lungs every 4 (four) hours as needed for wheezing or shortness of breath. 18 g 0   clonazePAM (KLONOPIN) 0.5 MG tablet Take 1 tablet (0.5 mg total) by mouth daily as needed for anxiety. 30 tablet 2   FLUoxetine (PROZAC) 20 MG capsule Take 1 capsule (20 mg total) by mouth daily. 30 capsule 2   No current facility-administered medications for this visit.     Musculoskeletal: Strength & Muscle Tone: na Gait & Station: na Patient leans: N/A  Psychiatric Specialty Exam: Review of Systems  All other systems reviewed and are negative.  There were no vitals taken for this visit.There is no height or weight on file to calculate BMI.  General Appearance: NA  Eye Contact:  NA  Speech:  Clear and Coherent  Volume:  Normal  Mood:  Euthymic  Affect:  NA  Thought Process:  Goal Directed  Orientation:  Full (Time, Place, and Person)  Thought Content: WDL   Suicidal Thoughts:  No  Homicidal Thoughts:  No  Memory:  Immediate;   Good Recent;   Good Remote;   Good  Judgement:  Good  Insight:  Good  Psychomotor Activity:  Normal  Concentration:  Concentration: Good and Attention  Span: Good  Recall:  Good  Fund of Knowledge: Good  Language: Good  Akathisia:  No  Handed:  Right  AIMS (if indicated): not done  Assets:  Communication Skills Desire for Improvement Physical Health Resilience Social Support  ADL's:  Intact  Cognition: WNL  Sleep:  Good   Screenings: AIMS    Flowsheet Row [facility] Ad[facility] ([facility]  AIMS Total Score 0 0      AUDIT    Flowsheet Row [facility]) [facility] Ad[facility] ([facility]  Alcohol Use Disorder Identification Test Final Score (AUDIT) 6 0      GAD-7    Flowsheet Row [facility]21 [facility]  Total GAD-7 Score 20      PHQ2-9    Flowsheet Row Video Visit from 07/02/2021 [facility] Counselor from 06/05/2021 in [facility] Video Visit from 06/04/2021 [facility] Video Visit from 03/21/2021 in [facility] Video Visit from 12/06/2020 [facility]  PHQ-2 Total Score 0 6 2 2 1   PHQ-9 Total Score -- 14 10 6  --      Flowsheet Row Video Visit from 07/02/2021 in [facility] Counselor from 06/05/2021 in [facility] Video Visit from 06/04/2021 in [facility]  C-SSRS RISK CATEGORY No Risk No Risk No Risk        Assessment and Plan: This patient is a 21 year old female with a history of depression and anxiety.  She is trying to do better with med  compliance and therapy.  Her depression has improved considerably.  She will continue Prozac 20 mg daily for depression and clonazepam 2.5 mg twice daily as needed for anxiety.  She will return to see me in 3 months   [name] Bethenny Losee, MD 07/02/2021, 8:51 AM

## 2021-07-10 ENCOUNTER — Other Ambulatory Visit: Payer: Self-pay

## 2021-07-10 ENCOUNTER — Ambulatory Visit (INDEPENDENT_AMBULATORY_CARE_PROVIDER_SITE_OTHER): Payer: Medicaid Other | Admitting: Clinical

## 2021-07-10 DIAGNOSIS — F419 Anxiety disorder, unspecified: Secondary | ICD-10-CM

## 2021-07-10 DIAGNOSIS — F331 Major depressive disorder, recurrent, moderate: Secondary | ICD-10-CM | POA: Diagnosis not present

## 2021-07-10 DIAGNOSIS — F1210 Cannabis abuse, uncomplicated: Secondary | ICD-10-CM | POA: Diagnosis not present

## 2021-07-10 DIAGNOSIS — F4310 Post-traumatic stress disorder, unspecified: Secondary | ICD-10-CM | POA: Diagnosis not present

## 2021-07-10 NOTE — Progress Notes (Signed)
Virtual Visit via Telephone Note   I connected with Chelsea Wade on 07/10/21 at  1:00 PM EST by telephone and verified that I am speaking with the correct person using two identifiers.   Location: Patient: Home Provider: Office   I discussed the limitations, risks, security and privacy concerns of performing an evaluation and management service by telephone and the availability of in person appointments. I also discussed with the patient that there may be a patient responsible charge related to this service. The patient expressed understanding and agreed to proceed.   THERAPIST PROGRESS NOTE   Session Time: 1:00PM-1:30PM   Participation Level: Active   Behavioral Response: CasualAlertDepressed   Type of Therapy: Individual Therapy   Treatment Goals addressed: Mood and Coping   Interventions: CBT, Motivational Interviewing, Solution Focused and Strength-based   Summary: Chelsea Wade is a 21 y.o. female who presents with Depression. The OPT therapist worked with the patient for her ongoing OPT treatment. The OPT therapist utilized Motivational Interviewing to assist in creating therapeutic repore. The patient in the session was engaged and work in collaboration giving feedback about her triggers and symptoms over the past few weeks. The patient spoke about her medication and noted she has now been on her medication consistently for around 5 days and is starting to notice improvement, the OPT therapist stressed consistency to get the benefit from her Medication. The OPT therapist utilized Cognitive Behavioral Therapy through cognitive restructuring as well as worked with the patient on coping strategies to assist in management of mood and being active. The OPT therapist worked with the patient providing support and psycho-education. The OPT therapist worked in the session with the patient on challenging negative thoughts and implementing positive thinking. The patient spoke about her  experience around work. The OPT therapist worked with the patient for holistic care she noted still plans to make an appointment [facility]. The patient noted she still has a appointment at the end of February to get her license.   Suicidal/Homicidal: Nowithout intent/plan   Therapist Response: The OPT therapist worked with the patient for the patients scheduled session. The patient was engaged in her session and gave feedback in relation to triggers, symptoms, and behavior responses over the past few weeks.. The OPT therapist worked with the patient utilizing an in session Cognitive Behavioral Therapy exercise. The patient was responsive in the session and verbalized, " I know myself and I self sabotage and I know things are moving in the right direction so I am worried I will self sabotage and a I do not want to do it this time". The OPT therapist reviewed with the patient  with using her cell phone to set a reminder as well as a pill divider and the patient confirmed she is using alarms.The OPT therapist will continue treatment work with the patient in her next scheduled session.   Plan: Return again in 2/3 weeks.   Diagnosis:      Axis I: Recurrent moderate,major depression with Anxiety, PTSD, Cannabis Use Disorder                           Axis II: No diagnosis   I discussed the assessment and treatment plan with the patient. The patient was provided an opportunity to ask questions and all were answered. The patient agreed with the plan and demonstrated an understanding of the instructions.   The patient was advised to call back or seek an in-person  evaluation if the symptoms worsen or if the condition fails to improve as anticipated.   I provided 30 minutes of non-face-to-face time during this encounter.   Suzan Garibaldi, LCSW   07/10/2021

## 2021-07-25 DIAGNOSIS — Z419 Encounter for procedure for purposes other than remedying health state, unspecified: Secondary | ICD-10-CM | POA: Diagnosis not present

## 2021-07-26 ENCOUNTER — Other Ambulatory Visit: Payer: Self-pay

## 2021-07-26 ENCOUNTER — Ambulatory Visit (INDEPENDENT_AMBULATORY_CARE_PROVIDER_SITE_OTHER): Payer: Medicaid Other | Admitting: Clinical

## 2021-07-26 DIAGNOSIS — F419 Anxiety disorder, unspecified: Secondary | ICD-10-CM

## 2021-07-26 DIAGNOSIS — F331 Major depressive disorder, recurrent, moderate: Secondary | ICD-10-CM

## 2021-07-26 DIAGNOSIS — F1210 Cannabis abuse, uncomplicated: Secondary | ICD-10-CM

## 2021-07-26 DIAGNOSIS — F4310 Post-traumatic stress disorder, unspecified: Secondary | ICD-10-CM

## 2021-07-26 NOTE — Progress Notes (Signed)
Virtual Visit via Telephone Note   I connected with Chelsea Wade on 07/10/21 at  1:00 PM EST by telephone and verified that I am speaking with the correct person using two identifiers.   Location: Patient: Home Provider: Office   I discussed the limitations, risks, security and privacy concerns of performing an evaluation and management service by telephone and the availability of in person appointments. I also discussed with the patient that there may be a patient responsible charge related to this service. The patient expressed understanding and agreed to proceed.   THERAPIST PROGRESS NOTE   Session Time: 1:00PM-1:30PM   Participation Level: Active   Behavioral Response: CasualAlertDepressed   Type of Therapy: Individual Therapy   Treatment Goals addressed: Mood and Coping   Interventions: CBT, Motivational Interviewing, Solution Focused and Strength-based   Summary: Chelsea Wade is a 21 y.o. female who presents with Depression. The OPT therapist worked with the patient for her ongoing OPT treatment. The OPT therapist utilized Motivational Interviewing to assist in creating therapeutic repore. The patient in the session was engaged and work in collaboration giving feedback about her triggers and symptoms over the past few weeks. 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 The OPT therapist utilized Cognitive Behavioral Therapy through cognitive restructuring as well as worked with the patient on coping strategies to assist in management of mood and being active. The OPT therapist worked with the patient providing support and psycho-education. The OPT therapist worked in the session with the patient on challenging negative thoughts and implementing positive thinking. The patient spoke  about her experience around work. The OPT therapist worked with the patient for holistic care she noted still plans to make an appointment [facility]. The patient noted she still has a appointment at the end of February to get her license.   Suicidal/Homicidal: Nowithout intent/plan   Therapist Response: The OPT therapist worked with the patient for the patients scheduled session. The patient was engaged in her session and gave feedback in relation to triggers, symptoms, and behavior responses over the past few weeks. The OPT therapist worked with the patient utilizing an in session Cognitive Behavioral Therapy exercise. The patient was responsive in the session and verbalized, " I now am doing good with regulating my sleep and starting to feel tired on my own its a routine thing now when I am working so I have to go to sleep so I can get up for work". The patient spoke about upcoming events in February that she is looking forward too. The OPT therapist reviewed with the patient continuing to use her phone alarms to help her with ongoing medication management consistency. The OPT therapist worked with the patient on her basic needs areas including sleep, eating habits, exercise, and hygiene.The OPT therapist will continue treatment work with the patient in her next scheduled session.   Plan: Return again in 2/3 weeks.   Diagnosis:      Axis I: Recurrent moderate,major depression with Anxiety, PTSD, Cannabis Use Disorder                           Axis II: No diagnosis   I discussed the assessment and treatment plan with the patient. The patient was provided an opportunity  to ask questions and all were answered. The patient agreed with the plan and demonstrated an understanding of the instructions.   The patient was advised to call back or seek an in-person evaluation if the symptoms worsen or if the condition fails to improve as anticipated.   I provided 30 minutes of non-face-to-face time during  this encounter.   Maye Hides, LCSW   07/10/2021

## 2021-08-01 ENCOUNTER — Other Ambulatory Visit: Payer: Self-pay

## 2021-08-01 ENCOUNTER — Ambulatory Visit
Admission: EM | Admit: 2021-08-01 | Discharge: 2021-08-01 | Disposition: A | Discharge status: Home / Self Care | Payer: Medicaid Other | Attending: Urgent Care | Admitting: Urgent Care

## 2021-08-01 DIAGNOSIS — R21 Rash and other nonspecific skin eruption: Secondary | ICD-10-CM

## 2021-08-01 DIAGNOSIS — R590 Localized enlarged lymph nodes: Secondary | ICD-10-CM | POA: Diagnosis not present

## 2021-08-01 LAB — POCT RAPID STREP A (OFFICE): Rapid Strep A Screen: NEGATIVE

## 2021-08-01 LAB — POCT MONO SCREEN (KUC): Mono, POC: NEGATIVE

## 2021-08-01 MED ORDER — NAPROXEN 500 MG PO TABS: 500.0000 mg | ORAL_TABLET | Freq: Two times a day (BID) | ORAL | 0 refills | Status: DC

## 2021-08-01 MED ORDER — CETIRIZINE HCL 10 MG PO TABS: 10.0000 mg | ORAL_TABLET | Freq: Every day | ORAL | 0 refills | Status: DC

## 2021-08-01 MED ORDER — LORATADINE 10 MG PO TABS: 10.0000 mg | ORAL_TABLET | Freq: Every day | ORAL | 0 refills | Status: DC

## 2021-08-01 NOTE — ED Triage Notes (Signed)
Pt c/o rash to lt side of face/chin/neck x2 days. States used a different lotion prior to rash. States took benadryl and relief rash.   Pt c/o swollen lymph node to lt side of neck x1wk.

## 2021-08-01 NOTE — ED Provider Notes (Signed)
Chelsea Wade   MRN: [id number] DOB: [birth year]  Subjective:   Chelsea Wade is a 21 y.o. female presenting for 2-day history of acute onset rash over the underside of her chin on either side extending into the left side of her face and cheek.  She is also had 1 week history of persistent left sided lymph node swelling and pain, left ear pain.  No runny or stuffy nose, sore throat, cough, chest pain, shortness of breath, wheezing, ear drainage.  No current facility-administered medications for this encounter.  Current Outpatient Medications:    albuterol (VENTOLIN HFA) 108 (90 Base) MCG/ACT inhaler, Inhale 2 puffs into the lungs every 4 (four) hours as needed for wheezing or shortness of breath., Disp: 18 g, Rfl: 0   clonazePAM (KLONOPIN) 0.5 MG tablet, Take 1 tablet (0.5 mg total) by mouth daily as needed for anxiety., Disp: 30 tablet, Rfl: 2   FLUoxetine (PROZAC) 20 MG capsule, Take 1 capsule (20 mg total) by mouth daily., Disp: 30 capsule, Rfl: 2   No Known Allergies  Past Medical History:  Diagnosis Date   ADHD (attention deficit hyperactivity disorder)    Asthma    Depression    Suicide attempt [facility])      Past Surgical History:  Procedure Laterality Date   TONSILLECTOMY      Family History  Problem Relation Age of Onset   Depression Mother    Bipolar disorder Father    Mental illness Father    Alcohol abuse Father    Schizophrenia Maternal Grandfather    Alcohol abuse Maternal Grandfather     Social History   Tobacco Use   Smoking status: Every Day    Packs/day: 0.25    Types: Cigarettes   Smokeless tobacco: Never  Vaping Use   Vaping Use: Every day  Substance Use Topics   Alcohol use: Yes    Alcohol/week: 3.0 standard drinks    Types: 3 Shots of liquor per week    Comment: occ   Drug use: Yes    Frequency: 3.0 times per week    Types: Marijuana    ROS   Objective:   Vitals: BP 124/80 (BP Location: Left Arm)    Pulse 94    Temp  98.1 F (36.7 C) (Oral)    Resp 18    LMP 07/04/2021    SpO2 98%   Physical Exam Constitutional:      General: She is not in acute distress.    Appearance: Normal appearance. She is well-developed and normal weight. She is not ill-appearing, toxic-appearing or diaphoretic.  HENT:     Head: Normocephalic and atraumatic.     Right Ear: Tympanic membrane, ear canal and external ear normal. No drainage or tenderness. No middle ear effusion. There is no impacted cerumen. Tympanic membrane is not erythematous.     Left Ear: Tympanic membrane, ear canal and external ear normal. No drainage or tenderness.  No middle ear effusion. There is no impacted cerumen. Tympanic membrane is not erythematous.     Nose: No congestion or rhinorrhea.     Mouth/Throat:     Mouth: Mucous membranes are moist. No oral lesions.     Pharynx: No pharyngeal swelling, oropharyngeal exudate, posterior oropharyngeal erythema or uvula swelling.     Tonsils: No tonsillar exudate or tonsillar abscesses.  Eyes:     General: No scleral icterus.       Right eye: No discharge.        Left eye:  No discharge.     Extraocular Movements: Extraocular movements intact.     Right eye: Normal extraocular motion.     Left eye: Normal extraocular motion.     Conjunctiva/sclera: Conjunctivae normal.  Cardiovascular:     Rate and Rhythm: Normal rate.  Pulmonary:     Effort: Pulmonary effort is normal.  Musculoskeletal:     Cervical back: Normal range of motion and neck supple.  Lymphadenopathy:     Head:     Right side of head: No submental, submandibular, tonsillar, preauricular, posterior auricular or occipital adenopathy.     Left side of head: No submental, submandibular, tonsillar, preauricular, posterior auricular or occipital adenopathy.     Cervical: Cervical adenopathy present.     Right cervical: No superficial, deep or posterior cervical adenopathy.    Left cervical: Superficial cervical adenopathy present. No deep or  posterior cervical adenopathy.     Upper Body:     Right upper body: No supraclavicular, axillary, pectoral or epitrochlear adenopathy.     Left upper body: No supraclavicular, axillary, pectoral or epitrochlear adenopathy.  Skin:    General: Skin is warm and dry.  Neurological:     General: No focal deficit present.     Mental Status: She is alert and oriented to person, place, and time.  Psychiatric:        Mood and Affect: Mood normal.        Behavior: Behavior normal.    Results for orders placed or performed during the hospital encounter of 08/01/21 (from the past 24 hour(s))  POCT rapid strep A     Status: None   Collection Time: 08/01/21  3:21 PM  Result Value Ref Range   Rapid Strep A Screen Negative Negative  POCT mono screen     Status: None   Collection Time: 08/01/21  3:42 PM  Result Value Ref Range   Mono, POC Negative Negative    Assessment and Plan :   PDMP not reviewed this encounter.  1. Rash and nonspecific skin eruption   2. Cervical lymphadenopathy    Patient did wonder if the rash was an allergic reaction as she tried a new lotion and as she started to use Benadryl got relief from the rash.  I advised against using any new foods or products or medications.  Recommended Claritin, Zyrtec during the day and Benadryl at night.  Regarding her cervical lymphadenopathy, she warrants further work-up with her PCP.  This includes imaging, blood work.  Patient will follow-up with them tomorrow.  Strep culture pending.  Recommended supportive care. Counseled patient on potential for adverse effects with medications prescribed/recommended today, ER and return-to-clinic precautions discussed, patient verbalized understanding.    Wallis Bamberg, PA-C 08/01/21 1620

## 2021-08-02 DIAGNOSIS — R590 Localized enlarged lymph nodes: Secondary | ICD-10-CM | POA: Diagnosis not present

## 2021-08-02 DIAGNOSIS — J3089 Other allergic rhinitis: Secondary | ICD-10-CM | POA: Diagnosis not present

## 2021-08-07 DIAGNOSIS — R590 Localized enlarged lymph nodes: Secondary | ICD-10-CM | POA: Diagnosis not present

## 2021-08-14 ENCOUNTER — Ambulatory Visit
Admission: EM | Admit: 2021-08-14 | Discharge: 2021-08-14 | Disposition: A | Discharge status: Home / Self Care | Payer: Medicaid Other | Attending: Emergency Medicine | Admitting: Emergency Medicine

## 2021-08-14 ENCOUNTER — Other Ambulatory Visit: Payer: Self-pay

## 2021-08-14 ENCOUNTER — Encounter: Payer: Self-pay | Admitting: Emergency Medicine

## 2021-08-14 DIAGNOSIS — J45901 Unspecified asthma with (acute) exacerbation: Secondary | ICD-10-CM | POA: Diagnosis not present

## 2021-08-14 DIAGNOSIS — B349 Viral infection, unspecified: Secondary | ICD-10-CM

## 2021-08-14 MED ORDER — AZITHROMYCIN 250 MG PO TABS: 250.0000 mg | ORAL_TABLET | Freq: Every day | ORAL | 0 refills | Status: DC

## 2021-08-14 MED ORDER — ALBUTEROL SULFATE HFA 108 (90 BASE) MCG/ACT IN AERS
1.0000 | INHALATION_SPRAY | Freq: Four times a day (QID) | RESPIRATORY_TRACT | 0 refills | Status: DC | PRN
Start: 2021-08-14 — End: 2022-11-22

## 2021-08-14 MED ORDER — PREDNISONE 10 MG PO TABS: 40.0000 mg | ORAL_TABLET | Freq: Every day | ORAL | 0 refills | Status: AC

## 2021-08-14 NOTE — ED Provider Notes (Signed)
Chelsea Wade    CSN: [id number] Arrival date & time: 08/14/21  1438      History   Chief Complaint Chief Complaint  Patient presents with   Nasal Congestion   Cough   Emesis   Nausea    HPI Chelsea Wade is a 21 y.o. female.  Patient presents with 1 week history of nasal congestion, postnasal drip, runny nose, cough, wheezing, shortness of breath, nausea, vomiting.  She denies fever, rash, diarrhea, or other symptoms.  Treatment at home with her albuterol inhaler but she needs a refill.  Her medical history includes asthma and seizures.  She denies current pregnancy or breastfeeding.     The history is provided by the patient and medical records.   Past Medical History:  Diagnosis Date   ADHD (attention deficit hyperactivity disorder)    Asthma    Depression    Suicide attempt [facility])     Patient Active Problem List   Diagnosis Date Noted   MDD (major depressive disorder), severe (HCC) 09/30/2017   MDD (major depressive disorder), recurrent episode, severe (HCC) 11/12/2015   Drug overdose 11/10/2015   Intentional overdose of drug in tablet form (HCC) 11/10/2015   Convulsions/seizures (HCC) 11/10/2015    Past Surgical History:  Procedure Laterality Date   TONSILLECTOMY      OB History   No obstetric history on file.      Home Medications    Prior to Admission medications   Medication Sig Start Date End Date Taking? Authorizing Provider  albuterol (VENTOLIN HFA) 108 (90 Base) MCG/ACT inhaler Inhale 1-2 puffs into the lungs every 6 (six) hours as needed for wheezing or shortness of breath. 08/14/21  Yes Mickie Bail, NP  azithromycin (ZITHROMAX) 250 MG tablet Take 1 tablet (250 mg total) by mouth daily. Take first 2 tablets together, then 1 every day until finished. 08/14/21  Yes Mickie Bail, NP  predniSONE (DELTASONE) 10 MG tablet Take 4 tablets (40 mg total) by mouth daily for 5 days. 08/14/21 08/19/21 Yes Mickie Bail, NP  cetirizine (ZYRTEC  ALLERGY) 10 MG tablet Take 1 tablet (10 mg total) by mouth daily. 08/01/21   Wallis Bamberg, PA-C  clonazePAM (KLONOPIN) 0.5 MG tablet Take 1 tablet (0.5 mg total) by mouth daily as needed for anxiety. 07/02/21 07/02/22  Myrlene Broker, MD  FLUoxetine (PROZAC) 20 MG capsule Take 1 capsule (20 mg total) by mouth daily. 07/02/21 07/02/22  Myrlene Broker, MD  loratadine (CLARITIN) 10 MG tablet Take 1 tablet (10 mg total) by mouth daily. 08/01/21   Wallis Bamberg, PA-C  naproxen (NAPROSYN) 500 MG tablet Take 1 tablet (500 mg total) by mouth 2 (two) times daily with a meal. 08/01/21   Wallis Bamberg, PA-C    Family History Family History  Problem Relation Age of Onset   Depression Mother    Bipolar disorder Father    Mental illness Father    Alcohol abuse Father    Schizophrenia Maternal Grandfather    Alcohol abuse Maternal Grandfather     Social History Social History   Tobacco Use   Smoking status: Every Day    Packs/day: 0.25    Types: Cigarettes   Smokeless tobacco: Never  Vaping Use   Vaping Use: Every day  Substance Use Topics   Alcohol use: Yes    Alcohol/week: 3.0 standard drinks    Types: 3 Shots of liquor per week    Comment: occ   Drug use: Yes  Frequency: 3.0 times per week    Types: Marijuana     Allergies   Patient has no known allergies.   Review of Systems Review of Systems  Constitutional:  Negative for chills and fever.  HENT:  Positive for congestion, postnasal drip, rhinorrhea and sinus pressure. Negative for ear pain and sore throat.   Respiratory:  Positive for cough, shortness of breath and wheezing.   Cardiovascular:  Negative for chest pain and palpitations.  Gastrointestinal:  Positive for nausea and vomiting. Negative for diarrhea.  Skin:  Negative for color change and rash.  All other systems reviewed and are negative.   Physical Exam Triage Vital Signs ED Triage Vitals  Enc Vitals Group     BP      Pulse      Resp      Temp      Temp src      SpO2       Weight      Height      Head Circumference      Peak Flow      Pain Score      Pain Loc      Pain Edu?      Excl. in GC?    No data found.  Updated Vital Signs BP 123/81    Pulse (!) 106    Temp 98.4 F (36.9 C)    Resp 20    LMP 07/24/2021 (Approximate)    SpO2 96%   Visual Acuity Right Eye Distance:   Left Eye Distance:   Bilateral Distance:    Right Eye Near:   Left Eye Near:    Bilateral Near:     Physical Exam Vitals and nursing note reviewed.  Constitutional:      General: She is not in acute distress.    Appearance: She is well-developed. She is obese. She is not ill-appearing.  HENT:     Right Ear: Tympanic membrane normal.     Left Ear: Tympanic membrane normal.     Nose: Congestion and rhinorrhea present.     Mouth/Throat:     Mouth: Mucous membranes are moist.     Pharynx: Oropharynx is clear.  Cardiovascular:     Rate and Rhythm: Normal rate and regular rhythm.     Heart sounds: Normal heart sounds.  Pulmonary:     Effort: Pulmonary effort is normal. No respiratory distress.     Breath sounds: Wheezing and rhonchi present.     Comments: Scattered expiratory wheezes and rhonchi.  Abdominal:     Palpations: Abdomen is soft.     Tenderness: There is no abdominal tenderness.  Musculoskeletal:     Cervical back: Neck supple.  Skin:    General: Skin is warm and dry.  Neurological:     Mental Status: She is alert.  Psychiatric:        Mood and Affect: Mood normal.        Behavior: Behavior normal.     UC Treatments / Results  Labs (all labs ordered are listed, but only abnormal results are displayed) Labs Reviewed  COVID-19, FLU A+B AND RSV    EKG   Radiology No results found.  Procedures Procedures (including critical care time)  Medications Ordered in UC Medications - No data to display  Initial Impression / Assessment and Plan / UC Course  I have reviewed the triage vital signs and the nursing notes.  Pertinent labs &  imaging results that were available during  my care of the patient were reviewed by me and considered in my medical decision making (see chart for details).  Asthma exacerbation, viral illness.  Patient declines chest xray.  No respiratory distress, O2 sat 96% on room air.  Treating with albuterol inhaler, prednisone, Zithromax.  Instructed her to follow-up with her PCP in 2 to 3 days for recheck.  ED precautions discussed.  COVID, Flu, RSV pending.  Instructed patient to self quarantine per CDC guidelines.  Patient agrees to plan of care.    Final Clinical Impressions(s) / UC Diagnoses   Final diagnoses:  Asthma with acute exacerbation, unspecified asthma severity, unspecified whether persistent  Viral illness     Discharge Instructions      Use the albuterol inhaler as directed.  Take the Zithromax and prednisone as directed.  Follow up with your primary care provider.    Your COVID, RSV, and Flu tests are pending.  You should self quarantine until the test results are back.    Take Tylenol or ibuprofen as needed for fever or discomfort.  Rest and keep yourself hydrated.        ED Prescriptions     Medication Sig Dispense Auth. Provider   albuterol (VENTOLIN HFA) 108 (90 Base) MCG/ACT inhaler Inhale 1-2 puffs into the lungs every 6 (six) hours as needed for wheezing or shortness of breath. 18 g Mickie Bail, NP   predniSONE (DELTASONE) 10 MG tablet Take 4 tablets (40 mg total) by mouth daily for 5 days. 20 tablet Mickie Bail, NP   azithromycin (ZITHROMAX) 250 MG tablet Take 1 tablet (250 mg total) by mouth daily. Take first 2 tablets together, then 1 every day until finished. 6 tablet Mickie Bail, NP      PDMP not reviewed this encounter.   Mickie Bail, NP 08/14/21 1527

## 2021-08-14 NOTE — Discharge Instructions (Addendum)
Use the albuterol inhaler as directed.  Take the Zithromax and prednisone as directed.  Follow up with your primary care provider.    Your COVID, RSV, and Flu tests are pending.  You should self quarantine until the test results are back.    Take Tylenol or ibuprofen as needed for fever or discomfort.  Rest and keep yourself hydrated.

## 2021-08-14 NOTE — ED Triage Notes (Signed)
Pt here with nasal congestion, chest congestion, N/V x 5 days.

## 2021-08-16 LAB — COVID-19, FLU A+B AND RSV
Influenza A, NAA: NOT DETECTED
Influenza B, NAA: NOT DETECTED
RSV, NAA: NOT DETECTED
SARS-CoV-2, NAA: NOT DETECTED

## 2021-08-21 ENCOUNTER — Ambulatory Visit: Admit: 2021-08-21 | Discharge status: Against Medical Advice | Payer: Medicaid Other

## 2021-08-22 DIAGNOSIS — Z419 Encounter for procedure for purposes other than remedying health state, unspecified: Secondary | ICD-10-CM | POA: Diagnosis not present

## 2021-08-23 ENCOUNTER — Ambulatory Visit (INDEPENDENT_AMBULATORY_CARE_PROVIDER_SITE_OTHER): Payer: Medicaid Other | Admitting: Clinical

## 2021-08-23 ENCOUNTER — Other Ambulatory Visit: Payer: Self-pay

## 2021-08-23 DIAGNOSIS — F419 Anxiety disorder, unspecified: Secondary | ICD-10-CM

## 2021-08-23 DIAGNOSIS — F4310 Post-traumatic stress disorder, unspecified: Secondary | ICD-10-CM

## 2021-08-23 DIAGNOSIS — F331 Major depressive disorder, recurrent, moderate: Secondary | ICD-10-CM

## 2021-08-23 DIAGNOSIS — F1210 Cannabis abuse, uncomplicated: Secondary | ICD-10-CM | POA: Diagnosis not present

## 2021-08-23 NOTE — Progress Notes (Signed)
Virtual Visit via Telephone Note ?  ?I connected with Chelsea Wade on 08/23/21 at  1:00 PM EST by telephone and verified that I am speaking with the correct person using two identifiers. ?  ?Location: ?Patient: Home ?Provider: Office ?  ?I discussed the limitations, risks, security and privacy concerns of performing an evaluation and management service by telephone and the availability of in person appointments. I also discussed with the patient that there may be a patient responsible charge related to this service. The patient expressed understanding and agreed to proceed. ?  ?THERAPIST PROGRESS NOTE ?  ?Session Time: 1:00PM-1:45PM ?  ?Participation Level: Active ?  ?Behavioral Response: CasualAlertDepressed ?  ?Type of Therapy: Individual Therapy ?  ?Treatment Goals addressed: Mood and Coping ?  ?Interventions: CBT, Motivational Interviewing, Solution Focused and Strength-based ?  ?Summary: Chelsea Wade is a 21 y.o. female who presents with Depression. The OPT therapist worked with the patient for her ongoing OPT treatment. The OPT therapist utilized Motivational Interviewing to assist in creating therapeutic repore. The patient in the session was engaged and work in collaboration giving feedback about her triggers and symptoms over the past few weeks. The patient spoke about the impact of being under the weather over the past few weeks and working with her healthcare provider to manage her symptoms. 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 The OPT therapist utilized Cognitive Behavioral Therapy through cognitive restructuring as well as worked with the patient on coping strategies to assist in management of mood and being active. The OPT therapist  worked with the patient providing support and psycho-education. The OPT therapist worked in the session with the patient on challenging negative thoughts and implementing positive thinking. 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 ?  ?Suicidal/Homicidal: Nowithout intent/plan ?  ?Therapist Response: The OPT therapist worked with the patient for the patients scheduled session. The patient was engaged in her session and gave feedback in relation to triggers, symptoms, and behavior responses over the past few weeks. The OPT therapist worked with the patient utilizing an in session Cognitive Behavioral Therapy exercise. The patient was responsive in the session and verbalized, " I realize my past trauma gets triggered by conflict and a  feeling of loss of control". The OPT therapist reviewed with the patient continuing to improve her medication management consistency. The OPT therapist worked with the patient on communication with her partner. The OPT therapist worked with the patient on emotion control and individualized coping skills.The OPT therapist worked with the patient on her basic needs areas including sleep, eating habits, exercise, and hygiene.The OPT therapist will continue treatment work with the patient in her next scheduled session. ?  ?Plan: Return again in 2/3 weeks. ?  ?Diagnosis:      Axis I: Recurrent moderate,major depression with Anxiety, PTSD, Cannabis Use Disorder ?  ?                        Axis II: No diagnosis ? ?Collaboration of Care: Additional collaboration for this session with patient PCP ?  ?Patient/Guardian was advised Release of Information must be obtained prior to any record release in order to collaborate their care with an outside provider. Patient/Guardian  was advised if they have not already done so to contact the registration department to sign all necessary forms in order for [place] to release information regarding their care.  ?  ?Consent:  Patient/Guardian gives verbal consent for treatment and assignment of benefits for services provided during this visit. Patient/Guardian expressed understanding and agreed to proceed.  ? ?  ?I discussed the assessment and treatment plan with the patient. The patient was provided an opportunity to ask questions and all were answered. The patient agreed with the plan and demonstrated an understanding of the instructions. ?  ?The patient was advised to call back or seek an in-person evaluation if the symptoms worsen or if the condition fails to improve as anticipated. ?  ?I provided 45 minutes of non-face-to-face time during this encounter. ?  ?Maye Hides, LCSW ?  ?08/23/2021 ?

## 2021-09-20 ENCOUNTER — Ambulatory Visit (INDEPENDENT_AMBULATORY_CARE_PROVIDER_SITE_OTHER): Payer: Medicaid Other | Admitting: Clinical

## 2021-09-20 DIAGNOSIS — F4310 Post-traumatic stress disorder, unspecified: Secondary | ICD-10-CM

## 2021-09-20 DIAGNOSIS — F419 Anxiety disorder, unspecified: Secondary | ICD-10-CM

## 2021-09-20 DIAGNOSIS — F331 Major depressive disorder, recurrent, moderate: Secondary | ICD-10-CM

## 2021-09-20 DIAGNOSIS — F1210 Cannabis abuse, uncomplicated: Secondary | ICD-10-CM | POA: Diagnosis not present

## 2021-09-20 NOTE — Plan of Care (Signed)
Verbal Consent 

## 2021-09-20 NOTE — Progress Notes (Signed)
Virtual Visit via Telephone Note ?  ?I connected with Chelsea Wade on 09/20/21 at  1:00 PM EST by telephone and verified that I am speaking with the correct person using two identifiers. ?  ?Location: ?Patient: Home ?Provider: Office ?  ?I discussed the limitations, risks, security and privacy concerns of performing an evaluation and management service by telephone and the availability of in person appointments. I also discussed with the patient that there may be a patient responsible charge related to this service. The patient expressed understanding and agreed to proceed. ?  ?THERAPIST PROGRESS NOTE ?  ?Session Time: 1:00PM-1:20PM ?  ?Participation Level: Active ?  ?Behavioral Response: CasualAlertDepressed ?  ?Type of Therapy: Individual Therapy ?  ?Treatment Goals addressed: Mood and Coping ?  ?Interventions: CBT, Motivational Interviewing, Solution Focused and Strength-based ?  ?Summary: Chelsea Wade is a 21 y.o. female who presents with Depression. The OPT therapist worked with the patient for her ongoing OPT treatment. The OPT therapist utilized Motivational Interviewing to assist in creating therapeutic repore. The patient in the session was engaged and work in collaboration giving feedback about her triggers and symptoms over the past few weeks. 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 The OPT therapist utilized Cognitive Behavioral Therapy through cognitive restructuring as well as worked with the patient on coping strategies to assist in management of mood and being active. The OPT therapist worked with the patient providing support and psycho-education. The OPT therapist worked  in the session with the patient on challenging negative thoughts and implementing positive thinking.  ?  ?Suicidal/Homicidal: Nowithout intent/plan ?  ?Therapist Response: The OPT therapist worked with the patient for the patients scheduled session. The patient was engaged in her session and gave feedback in relation to triggers, symptoms, and behavior responses over the past few weeks. The OPT therapist worked with the patient utilizing an in session Cognitive Behavioral Therapy exercise. 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 The OPT therapist reviewed with the patient continuing to improve her medication management consistency. The OPT therapist worked with the patient on communication with her partner. The OPT therapist worked with the patient on emotion control and individualized coping skills.The OPT therapist worked with the patient on her basic needs areas including sleep, eating habits, exercise, and hygiene.The OPT therapist will continue treatment work with the patient in her next scheduled session. ?  ?Plan: Return again in 2/3 weeks. ?  ?Diagnosis:      Axis I: Recurrent moderate,major depression with Anxiety, PTSD, Cannabis Use Disorder ?  ?                        Axis II: No diagnosis ?  ?Collaboration of Care: Additional collaboration for this session with patient PCP ?  ?Patient/Guardian was advised Release of Information must be obtained prior to any record release in order to collaborate their care with an outside provider. Patient/Guardian was advised if they have not already done so to contact the  registration department to sign all necessary forms in order for [place] to release information regarding their care.  ?  ?Consent: Patient/Guardian gives verbal consent for treatment and assignment of benefits for services provided during  this visit. Patient/Guardian expressed understanding and agreed to proceed.  ?  ?  ?I discussed the assessment and treatment plan with the patient. The patient was provided an opportunity to ask questions and all were answered. The patient agreed with the plan and demonstrated an understanding of the instructions. ?  ?The patient was advised to call back or seek an in-person evaluation if the symptoms worsen or if the condition fails to improve as anticipated. ?  ?I provided 20 minutes of non-face-to-face time during this encounter. ?  ?Suzan Garibaldi, LCSW ?  ?09/20/2021 ?

## 2021-09-22 DIAGNOSIS — Z419 Encounter for procedure for purposes other than remedying health state, unspecified: Secondary | ICD-10-CM | POA: Diagnosis not present

## 2021-09-28 DIAGNOSIS — J343 Hypertrophy of nasal turbinates: Secondary | ICD-10-CM | POA: Diagnosis not present

## 2021-09-28 DIAGNOSIS — Z9889 Other specified postprocedural states: Secondary | ICD-10-CM | POA: Diagnosis not present

## 2021-09-28 DIAGNOSIS — G4763 Sleep related bruxism: Secondary | ICD-10-CM | POA: Diagnosis not present

## 2021-09-28 DIAGNOSIS — Z9089 Acquired absence of other organs: Secondary | ICD-10-CM | POA: Diagnosis not present

## 2021-09-28 DIAGNOSIS — H9203 Otalgia, bilateral: Secondary | ICD-10-CM | POA: Diagnosis not present

## 2021-09-28 DIAGNOSIS — Z77098 Contact with and (suspected) exposure to other hazardous, chiefly nonmedicinal, chemicals: Secondary | ICD-10-CM | POA: Diagnosis not present

## 2021-10-22 DIAGNOSIS — Z419 Encounter for procedure for purposes other than remedying health state, unspecified: Secondary | ICD-10-CM | POA: Diagnosis not present

## 2021-10-24 ENCOUNTER — Telehealth (HOSPITAL_COMMUNITY): Payer: Self-pay | Admitting: Clinical

## 2021-10-24 ENCOUNTER — Ambulatory Visit (HOSPITAL_COMMUNITY): Payer: Medicaid Other | Admitting: Clinical

## 2021-10-24 NOTE — Telephone Encounter (Signed)
The patient verbalized they forgot about the appointment and would call in to reschedule ?

## 2021-11-06 ENCOUNTER — Encounter (HOSPITAL_COMMUNITY): Payer: Self-pay | Admitting: Psychiatry

## 2021-11-06 ENCOUNTER — Telehealth (INDEPENDENT_AMBULATORY_CARE_PROVIDER_SITE_OTHER): Payer: Medicaid Other | Admitting: Psychiatry

## 2021-11-06 DIAGNOSIS — F419 Anxiety disorder, unspecified: Secondary | ICD-10-CM | POA: Diagnosis not present

## 2021-11-06 DIAGNOSIS — F331 Major depressive disorder, recurrent, moderate: Secondary | ICD-10-CM

## 2021-11-06 MED ORDER — FLUOXETINE HCL 20 MG PO CAPS: 20.0000 mg | ORAL_CAPSULE | Freq: Every day | ORAL | 2 refills | Status: DC

## 2021-11-06 MED ORDER — CLONAZEPAM 0.5 MG PO TABS: 0.5000 mg | ORAL_TABLET | Freq: Every day | ORAL | 2 refills | Status: DC | PRN

## 2021-11-06 NOTE — Progress Notes (Signed)
Virtual Visit via Telephone Note ? ?I connected with Chelsea Wade on 11/06/21 at  1:20 PM EDT by telephone and verified that I am speaking with the correct person using two identifiers. ? ?Location: ?Patient: home ?Provider: office ?  ?I discussed the limitations, risks, security and privacy concerns of performing an evaluation and management service by telephone and the availability of in person appointments. I also discussed with the patient that there may be a patient responsible charge related to this service. The patient expressed understanding and agreed to proceed. ? ? ? ? ?  ?I discussed the assessment and treatment plan with the patient. The patient was provided an opportunity to ask questions and all were answered. The patient agreed with the plan and demonstrated an understanding of the instructions. ?  ?The patient was advised to call back or seek an in-person evaluation if the symptoms worsen or if the condition fails to improve as anticipated. ? ?I provided 15 minutes of non-face-to-face time during this encounter. ? ? ?Diannia Ruder, MD ? ?BH MD/PA/NP OP Progress Note ? ?11/06/2021 1:42 PM ?Chelsea Wade  ?MRN:  [id number] ? ?Chief Complaint:  ?Chief Complaint  ?Patient presents with  ? Depression  ? Anxiety  ? Follow-up  ? ?HPI: This patient is a 21 year old white female who lives with her boyfriend now in [place].  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 ? ?The patient returns for follow-up after 4 months.  She states overall she is doing pretty well.  She and her boyfriend have moved out on their own and she is happy about this.  Her mother is finally getting released from [facility] to a rehab in [place].  Her mother had suffered sepsis and is on a ventilator but hopefully will be able to get off over time. ? ?The patient herself states she is not currently depressed or significantly anxious.  She is sleeping well.  She is working on her propensity to get angry and irritable with her  therapist.  She really does not think this would be something to address with medication.  She denies thoughts of self-harm or suicidal ideation and her energy is good.  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  ICD-10-CM   ?1. Recurrent moderate major depressive disorder with anxiety (HCC)  F33.1   ? F41.9   ?  ? ? ?Past Psychiatric History:  The patient has had 2 previous psychiatric hospitalizations and as well as medication trials of Prozac Lexapro Strattera and Focalin XR ? ?Past Medical History:  ?Past Medical History:  ?Diagnosis Date  ? ADHD (attention deficit hyperactivity disorder)   ? Asthma   ? Depression   ? Suicide attempt [facility])   ?  ?Past Surgical History:  ?Procedure Laterality Date  ? TONSILLECTOMY    ? ? ?Family Psychiatric History: see below ? ?Family History:  ?Family History  ?Problem Relation Age of Onset  ? Depression Mother   ? Bipolar disorder Father   ? Mental illness Father   ? Alcohol abuse Father   ? Schizophrenia Maternal Grandfather   ? Alcohol abuse Maternal Grandfather   ? ? ?Social History:  ?Social History  ? ?Socioeconomic History  ? Marital status: Single  ?  Spouse name: Not on file  ? Number of children: Not on file  ? Years of education: Not on file  ? Highest education level: Not on file  ?Occupational History  ?  Not on file  ?Tobacco Use  ? Smoking status: Every Day  ?  Packs/day: 0.25  ?  Types: Cigarettes  ? Smokeless tobacco: Never  ?Vaping Use  ? Vaping Use: Every day  ?Substance and Sexual Activity  ? Alcohol use: Yes  ?  Alcohol/week: 3.0 standard drinks  ?  Types: 3 Shots of liquor per week  ?  Comment: occ  ? Drug use: Yes  ?  Frequency: 3.0 times per week  ?  Types: Marijuana  ? Sexual activity: Not Currently  ?  Birth control/protection: None  ?Other Topics Concern  ? Not on file  ?Social History Narrative  ? Lives with mom, step-dad, brother. Step brothers every other weekend.  ? ?Social Determinants of  Health  ? ?Financial Resource Strain: Not on file  ?Food Insecurity: Not on file  ?Transportation Needs: Not on file  ?Physical Activity: Not on file  ?Stress: Not on file  ?Social Connections: Not on file  ? ? ?Allergies: No Known Allergies ? ?Metabolic Disorder Labs: ?Lab Results  ?Component Value Date  ? HGBA1C 5.2 10/02/2017  ? MPG 102.54 10/02/2017  ? MPG 108 11/14/2015  ? ?Lab Results  ?Component Value Date  ? PROLACTIN 57.7 (H) 10/02/2017  ? ?Lab Results  ?Component Value Date  ? CHOL 145 10/02/2017  ? TRIG 89 10/02/2017  ? HDL 39 (L) 10/02/2017  ? CHOLHDL 3.7 10/02/2017  ? VLDL 18 10/02/2017  ? LDLCALC 88 10/02/2017  ? LDLCALC 53 11/14/2015  ? ?Lab Results  ?Component Value Date  ? TSH 4.562 10/02/2017  ? TSH 3.926 11/14/2015  ? ? ?Therapeutic Level Labs: ?No results found for: LITHIUM ?No results found for: VALPROATE ?No components found for:  CBMZ ? ?Current Medications: ?Current Outpatient Medications  ?Medication Sig Dispense Refill  ? albuterol (VENTOLIN HFA) 108 (90 Base) MCG/ACT inhaler Inhale 1-2 puffs into the lungs every 6 (six) hours as needed for wheezing or shortness of breath. 18 g 0  ? azithromycin (ZITHROMAX) 250 MG tablet Take 1 tablet (250 mg total) by mouth daily. Take first 2 tablets together, then 1 every day until finished. 6 tablet 0  ? cetirizine (ZYRTEC ALLERGY) 10 MG tablet Take 1 tablet (10 mg total) by mouth daily. 30 tablet 0  ? clonazePAM (KLONOPIN) 0.5 MG tablet Take 1 tablet (0.5 mg total) by mouth daily as needed for anxiety. 30 tablet 2  ? FLUoxetine (PROZAC) 20 MG capsule Take 1 capsule (20 mg total) by mouth daily. 30 capsule 2  ? loratadine (CLARITIN) 10 MG tablet Take 1 tablet (10 mg total) by mouth daily. 30 tablet 0  ? naproxen (NAPROSYN) 500 MG tablet Take 1 tablet (500 mg total) by mouth 2 (two) times daily with a meal. 30 tablet 0  ? ?No current facility-administered medications for this visit.  ? ? ? ?Musculoskeletal: ?Strength & Muscle Tone: na ?Gait & Station:  na ?Patient leans: N/A ? ?Psychiatric Specialty Exam: ?Review of Systems  ?All other systems reviewed and are negative.  ?There were no vitals taken for this visit.There is no height or weight on file to calculate BMI.  ?General Appearance: NA  ?Eye Contact:  NA  ?Speech:  Clear and Coherent  ?Volume:  Normal  ?Mood:  Euthymic  ?Affect:  NA  ?Thought Process:  Goal Directed  ?Orientation:  Full (Time, Place, and Person)  ?Thought Content: WDL   ?Suicidal Thoughts:  No  ?Homicidal Thoughts:  No  ?Memory:  Immediate;   Good ?Recent;  Good ?Remote;   Fair  ?Judgement:  Good  ?Insight:  Good  ?Psychomotor Activity:  Normal  ?Concentration:  Concentration: Good and Attention Span: Good  ?Recall:  Good  ?Fund of Knowledge: Good  ?Language: Good  ?Akathisia:  No  ?Handed:  Right  ?AIMS (if indicated): not done  ?Assets:  Communication Skills ?Desire for Improvement ?Physical Health ?Resilience ?Social Support ?Talents/Skills  ?ADL's:  Intact  ?Cognition: WNL  ?Sleep:  Good  ? ?Screenings: ?AIMS   ? ?Flowsheet Row [facility] ([facility]  ?AIMS Total Score 0 0  ? ?  ? ?AUDIT   ? ?Flowsheet Row [facility] Ad[facility] ([facility]  ?Alcohol Use Disorder Identification Test Final Score (AUDIT) 6 0  ? ?  ? ?GAD-7   ? ?Flowsheet Row [facility]  ?Total GAD-7 Score 20  ? ?  ? ?PHQ2-9   ? ?Flowsheet Row Video Visit from 11/06/2021 [facility] Video Visit from 07/02/2021 [facility] Counselor from 06/05/2021 [facility] Video Visit from  06/04/2021 [facility] Video Visit from 03/21/2021 [facility]  ?PHQ-2 Total Score 0 0 6 2 2   ?PHQ-9 Total Score -- -

## 2021-11-12 ENCOUNTER — Ambulatory Visit (INDEPENDENT_AMBULATORY_CARE_PROVIDER_SITE_OTHER): Payer: Medicaid Other | Admitting: Clinical

## 2021-11-12 DIAGNOSIS — F4310 Post-traumatic stress disorder, unspecified: Secondary | ICD-10-CM

## 2021-11-12 DIAGNOSIS — F331 Major depressive disorder, recurrent, moderate: Secondary | ICD-10-CM

## 2021-11-12 DIAGNOSIS — F1210 Cannabis abuse, uncomplicated: Secondary | ICD-10-CM | POA: Diagnosis not present

## 2021-11-12 DIAGNOSIS — F419 Anxiety disorder, unspecified: Secondary | ICD-10-CM | POA: Diagnosis not present

## 2021-11-12 NOTE — Plan of Care (Signed)
Verbal Consent 

## 2021-11-12 NOTE — Progress Notes (Signed)
Virtual Visit via Telephone Note   I connected with Chelsea Wade on 11/12/21 at  11:00 AM EST by telephone and verified that I am speaking with the correct person using two identifiers.   Location: Patient: Home Provider: Office   I discussed the limitations, risks, security and privacy concerns of performing an evaluation and management service by telephone and the availability of in person appointments. I also discussed with the patient that there may be a patient responsible charge related to this service. The patient expressed understanding and agreed to proceed.   THERAPIST PROGRESS NOTE   Session Time: 11:00AM-11:30AM   Participation Level: Active   Behavioral Response: CasualAlertDepressed   Type of Therapy: Individual Therapy   Treatment Goals addressed: Mood and Coping   Interventions: CBT, Motivational Interviewing, Solution Focused and Strength-based   Summary: Chelsea Wade is a 21 y.o. female who presents with Depression. The OPT therapist worked with the patient for her ongoing OPT treatment. The OPT therapist utilized Motivational Interviewing to assist in creating therapeutic repore. The patient in the session was engaged and work in collaboration giving feedback about her triggers and symptoms over the past few weeks. The patient spoke about the impact of  large recent changes including moving to [facility] and noted the change has been helpful in improving her relationship with her partner. Additionally the patient noted that she has accepted another job and will be starting her new position in September.The OPT therapist worked with the patient providing support and psycho-education. The OPT therapist worked in the session with the patient on challenging negative thoughts and implementing positive thinking. The patient spoke about being in a better head space and feeling she is maturing and focusing on her future.   Suicidal/Homicidal: Nowithout intent/plan    Therapist Response: The OPT therapist worked with the patient for the patients scheduled session. The patient was engaged in her session and gave feedback in relation to triggers, symptoms, and behavior responses over the past few weeks. The OPT therapist worked with the patient utilizing an in session Cognitive Behavioral Therapy exercise. The patient was responsive in the session and verbalized, " We are still getting settled and adjusting to moving to [facility], but over all this so far has been a big improvement and getting out from under his Mom and living independently for the first time is a big adjustment but I think things are starting to line up the way I want them too". The OPT therapist reviewed with the patient continuing to improve her medication management consistency. The OPT therapist worked with the patient on communication with her partner. The OPT therapist worked with the patient on emotion control and individualized coping skills.The OPT therapist worked with the patient on her basic needs areas including sleep, eating habits, exercise, and hygiene.The OPT therapist will continue treatment work with the patient in her next scheduled session.   Plan: Return again in 2/3 weeks.   Diagnosis:      Axis I: Recurrent moderate,major depression with Anxiety, PTSD, Cannabis Use Disorder                           Axis II: No diagnosis   Collaboration of Care: No additional collaboration for this session.   Patient/Guardian was advised Release of Information must be obtained prior to any record release in order to collaborate their care with an outside provider. Patient/Guardian was advised if they have not already done so to  contact the registration department to sign all necessary forms in order for [place] to release information regarding their care.    Consent: Patient/Guardian gives verbal consent for treatment and assignment of benefits for services provided during this visit.  Patient/Guardian expressed understanding and agreed to proceed.      I discussed the assessment and treatment plan with the patient. The patient was provided an opportunity to ask questions and all were answered. The patient agreed with the plan and demonstrated an understanding of the instructions.   The patient was advised to call back or seek an in-person evaluation if the symptoms worsen or if the condition fails to improve as anticipated.   I provided 30 minutes of non-face-to-face time during this encounter.   Suzan Garibaldi, LCSW  11/12/2021

## 2021-11-20 DIAGNOSIS — Z0001 Encounter for general adult medical examination with abnormal findings: Secondary | ICD-10-CM | POA: Diagnosis not present

## 2021-11-20 DIAGNOSIS — R112 Nausea with vomiting, unspecified: Secondary | ICD-10-CM | POA: Diagnosis not present

## 2021-11-20 DIAGNOSIS — J3089 Other allergic rhinitis: Secondary | ICD-10-CM | POA: Diagnosis not present

## 2021-11-20 DIAGNOSIS — Z68.41 Body mass index (BMI) pediatric, greater than or equal to 95th percentile for age: Secondary | ICD-10-CM | POA: Diagnosis not present

## 2021-11-22 DIAGNOSIS — Z419 Encounter for procedure for purposes other than remedying health state, unspecified: Secondary | ICD-10-CM | POA: Diagnosis not present

## 2021-12-08 ENCOUNTER — Ambulatory Visit: Admit: 2021-12-08 | Discharge status: Against Medical Advice | Payer: Medicaid Other

## 2021-12-11 ENCOUNTER — Ambulatory Visit
Admission: RE | Admit: 2021-12-11 | Discharge: 2021-12-11 | Disposition: A | Discharge status: Home / Self Care | Payer: Medicaid Other | Source: Ambulatory Visit | Attending: Family Medicine | Admitting: Family Medicine

## 2021-12-11 VITALS — BP 115/84 | HR 97 | Temp 98.3°F | Resp 18

## 2021-12-11 DIAGNOSIS — J069 Acute upper respiratory infection, unspecified: Secondary | ICD-10-CM

## 2021-12-11 DIAGNOSIS — J4521 Mild intermittent asthma with (acute) exacerbation: Secondary | ICD-10-CM | POA: Diagnosis not present

## 2021-12-11 DIAGNOSIS — L02419 Cutaneous abscess of limb, unspecified: Secondary | ICD-10-CM

## 2021-12-11 MED ORDER — PREDNISONE 20 MG PO TABS: 40.0000 mg | ORAL_TABLET | Freq: Every day | ORAL | 0 refills | Status: AC

## 2021-12-11 MED ORDER — BENZONATATE 100 MG PO CAPS: 100.0000 mg | ORAL_CAPSULE | Freq: Three times a day (TID) | ORAL | 0 refills | Status: DC | PRN

## 2021-12-11 MED ORDER — AMOXICILLIN-POT CLAVULANATE 875-125 MG PO TABS: 1.0000 | ORAL_TABLET | Freq: Two times a day (BID) | ORAL | 0 refills | Status: AC

## 2021-12-11 NOTE — Discharge Instructions (Addendum)
Take amoxicillin-clavulanate 875 mg--1 tab twice daily with food for 7 days  Take benzonatate 100 mg, 1 tab every 8 hours as needed for cough.  Prednisone 20 mg-- take 2 daily for 5 days.  This is for inflammation in your lungs  Clean the wound in your underarm with warm soapy water or hydrogen peroxide 2 times a day.

## 2021-12-11 NOTE — ED Triage Notes (Signed)
Patient presents to Urgent Care with complaints of abscess under L arm, that she has been applying heat to. Pt reports very painful and swollen since last week.   Patient reports new onset of cough and congestion since Saturday. Pt reports motrin for symptoms.

## 2021-12-11 NOTE — ED Provider Notes (Addendum)
[facility]    CSN: [id number] Arrival date & time: 12/11/21  0949      History   Chief Complaint Chief Complaint  Patient presents with   Abscess    Entered by patient    HPI Chelsea Wade is a 21 y.o. female.    Abscess  Here for 2 things  For about 1 week she has noticed swelling and pain in her left underarm area that has been getting worse.  She has been trying warm compresses and it is not helping.  She also started having congestion on June 17.  Then the cough began in the last 24 hours.  She does have a history of asthma, and it is bothering her worse.  She maybe had some fever a couple of days ago  Vomiting or diarrhea  Last menstrual period was 2 weeks ago  Past Medical History:  Diagnosis Date   ADHD (attention deficit hyperactivity disorder)    Asthma    Depression    Suicide attempt [facility])     Patient Active Problem List   Diagnosis Date Noted   MDD (major depressive disorder), severe (HCC) 09/30/2017   MDD (major depressive disorder), recurrent episode, severe (HCC) 11/12/2015   Drug overdose 11/10/2015   Intentional overdose of drug in tablet form (HCC) 11/10/2015   Convulsions/seizures (HCC) 11/10/2015    Past Surgical History:  Procedure Laterality Date   TONSILLECTOMY      OB History   No obstetric history on file.      Home Medications    Prior to Admission medications   Medication Sig Start Date End Date Taking? Authorizing Provider  amoxicillin-clavulanate (AUGMENTIN) 875-125 MG tablet Take 1 tablet by mouth 2 (two) times daily for 7 days. 12/11/21 12/18/21 Yes Nedra Mcinnis, Janace Aris, MD  benzonatate (TESSALON) 100 MG capsule Take 1 capsule (100 mg total) by mouth 3 (three) times daily as needed for cough. 12/11/21  Yes Lilinoe Acklin, Janace Aris, MD  predniSONE (DELTASONE) 20 MG tablet Take 2 tablets (40 mg total) by mouth daily with breakfast for 5 days. 12/11/21 12/16/21 Yes Tyion Boylen, Janace Aris, MD  albuterol (VENTOLIN HFA) 108  (90 Base) MCG/ACT inhaler Inhale 1-2 puffs into the lungs every 6 (six) hours as needed for wheezing or shortness of breath. 08/14/21   Mickie Bail, NP  cetirizine (ZYRTEC ALLERGY) 10 MG tablet Take 1 tablet (10 mg total) by mouth daily. 08/01/21   Wallis Bamberg, PA-C  clonazePAM (KLONOPIN) 0.5 MG tablet Take 1 tablet (0.5 mg total) by mouth daily as needed for anxiety. 11/06/21 11/06/22  Myrlene Broker, MD  FLUoxetine (PROZAC) 20 MG capsule Take 1 capsule (20 mg total) by mouth daily. 11/06/21 11/06/22  Myrlene Broker, MD  loratadine (CLARITIN) 10 MG tablet Take 1 tablet (10 mg total) by mouth daily. 08/01/21   Wallis Bamberg, PA-C    Family History Family History  Problem Relation Age of Onset   Depression Mother    Bipolar disorder Father    Mental illness Father    Alcohol abuse Father    Schizophrenia Maternal Grandfather    Alcohol abuse Maternal Grandfather     Social History Social History   Tobacco Use   Smoking status: Every Day    Packs/day: 0.25    Types: Cigarettes   Smokeless tobacco: Never  Vaping Use   Vaping Use: Every day  Substance Use Topics   Alcohol use: Yes    Alcohol/week: 3.0 standard drinks of alcohol  Types: 3 Shots of liquor per week    Comment: occ   Drug use: Yes    Frequency: 3.0 times per week    Types: Marijuana     Allergies   Patient has no known allergies.   Review of Systems Review of Systems   Physical Exam Triage Vital Signs ED Triage Vitals  Enc Vitals Group     BP 12/11/21 1040 115/84     Pulse Rate 12/11/21 1040 97     Resp 12/11/21 1040 18     Temp 12/11/21 1040 98.3 F (36.8 C)     Temp Source 12/11/21 1040 Oral     SpO2 12/11/21 1040 98 %     Weight --      Height --      Head Circumference --      Peak Flow --      Pain Score 12/11/21 1039 7     Pain Loc --      Pain Edu? --      Excl. in GC? --    No data found.  Updated Vital Signs BP 115/84   Pulse 97   Temp 98.3 F (36.8 C) (Oral)   Resp 18   LMP  11/27/2021 (Approximate)   SpO2 98%   Visual Acuity Right Eye Distance:   Left Eye Distance:   Bilateral Distance:    Right Eye Near:   Left Eye Near:    Bilateral Near:     Physical Exam Vitals reviewed.  Constitutional:      General: She is not in acute distress.    Appearance: She is not toxic-appearing.  HENT:     Right Ear: Tympanic membrane and ear canal normal.     Left Ear: Tympanic membrane and ear canal normal.     Nose: Nose normal.     Mouth/Throat:     Mouth: Mucous membranes are moist.     Comments: There is clear mucus draining in the oropharynx Eyes:     Extraocular Movements: Extraocular movements intact.     Conjunctiva/sclera: Conjunctivae normal.     Pupils: Pupils are equal, round, and reactive to light.  Cardiovascular:     Rate and Rhythm: Normal rate and regular rhythm.     Heart sounds: No murmur heard. Pulmonary:     Effort: Pulmonary effort is normal. No respiratory distress.     Breath sounds: No stridor. No wheezing, rhonchi or rales.  Musculoskeletal:     Cervical back: Neck supple.  Lymphadenopathy:     Cervical: No cervical adenopathy.  Skin:    Capillary Refill: Capillary refill takes less than 2 seconds.     Coloration: Skin is not jaundiced or pale.     Comments: There is an area of pink induration and tenderness approximately 2 cm x 1 cm in her left axilla.  There is an area of fluctuance in the center.  Neurological:     General: No focal deficit present.     Mental Status: She is alert and oriented to person, place, and time.  Psychiatric:        Behavior: Behavior normal.      UC Treatments / Results  Labs (all labs ordered are listed, but only abnormal results are displayed) Labs Reviewed  NOVEL CORONAVIRUS, NAA    EKG   Radiology No results found.  Procedures Procedures (including critical care time)  Medications Ordered in UC Medications - No data to display  Initial Impression / Assessment and  Plan / UC  Course  I have reviewed the triage vital signs and the nursing notes.  Pertinent labs & imaging results that were available during my care of the patient were reviewed by me and considered in my medical decision making (see chart for details).     After verbal consent was obtained, lidocaine plain was used to place infiltrative anesthesia in the fluctuant area.  #11 blade was used to make a stab wound, and approximately 3 mL of purulent material was obtained.  Gauze bandage applied, and wound care was explained.  Estimated blood loss none.  Complications none  She will take antibiotics for the abscess, and I will treat for asthma exacerbation.  We will swab for COVID, and she should have Paxlovid prescription if she is positive for COVID.  Creatinine is normal Final Clinical Impressions(s) / UC Diagnoses   Final diagnoses:  Abscess, axilla  Mild intermittent asthma with acute exacerbation  Viral URI with cough     Discharge Instructions      Take amoxicillin-clavulanate 875 mg--1 tab twice daily with food for 7 days  Take benzonatate 100 mg, 1 tab every 8 hours as needed for cough.  Prednisone 20 mg-- take 2 daily for 5 days.  This is for inflammation in your lungs  Clean the wound in your underarm with warm soapy water or hydrogen peroxide 2 times a day.     ED Prescriptions     Medication Sig Dispense Auth. Provider   amoxicillin-clavulanate (AUGMENTIN) 875-125 MG tablet Take 1 tablet by mouth 2 (two) times daily for 7 days. 14 tablet Aleni Andrus, Janace Aris, MD   predniSONE (DELTASONE) 20 MG tablet Take 2 tablets (40 mg total) by mouth daily with breakfast for 5 days. 10 tablet Zenia Resides, MD   benzonatate (TESSALON) 100 MG capsule Take 1 capsule (100 mg total) by mouth 3 (three) times daily as needed for cough. 21 capsule Zenia Resides, MD      PDMP not reviewed this encounter.   Zenia Resides, MD 12/11/21 1122    Zenia Resides, MD 12/11/21  1125

## 2021-12-12 LAB — NOVEL CORONAVIRUS, NAA: SARS-CoV-2, NAA: NOT DETECTED

## 2021-12-22 DIAGNOSIS — Z419 Encounter for procedure for purposes other than remedying health state, unspecified: Secondary | ICD-10-CM | POA: Diagnosis not present

## 2022-01-22 ENCOUNTER — Ambulatory Visit (INDEPENDENT_AMBULATORY_CARE_PROVIDER_SITE_OTHER): Payer: Medicaid Other | Admitting: Clinical

## 2022-01-22 DIAGNOSIS — F331 Major depressive disorder, recurrent, moderate: Secondary | ICD-10-CM

## 2022-01-22 DIAGNOSIS — F1210 Cannabis abuse, uncomplicated: Secondary | ICD-10-CM

## 2022-01-22 DIAGNOSIS — F4310 Post-traumatic stress disorder, unspecified: Secondary | ICD-10-CM

## 2022-01-22 DIAGNOSIS — F419 Anxiety disorder, unspecified: Secondary | ICD-10-CM

## 2022-01-22 DIAGNOSIS — Z419 Encounter for procedure for purposes other than remedying health state, unspecified: Secondary | ICD-10-CM | POA: Diagnosis not present

## 2022-01-22 NOTE — Progress Notes (Signed)
Virtual Visit via Telephone Note   I connected with Chelsea Wade on 01/22/22 at  11:00 AM EST by telephone and verified that I am speaking with the correct person using two identifiers.   Location: Patient: Home Provider: Office   I discussed the limitations, risks, security and privacy concerns of performing an evaluation and management service by telephone and the availability of in person appointments. I also discussed with the patient that there may be a patient responsible charge related to this service. The patient expressed understanding and agreed to proceed.   THERAPIST PROGRESS NOTE   Session Time: 11:00 AM-11:55 AM   Participation Level: Active   Behavioral Response: CasualAlertDepressed   Type of Therapy: Individual Therapy   Treatment Goals addressed: Mood and Coping   Interventions: CBT, Motivational Interviewing, Solution Focused and Strength-based   Summary: Chelsea Wade is a 21 y.o. female who presents with Depression. The OPT therapist worked with the patient for her ongoing OPT treatment. The OPT therapist utilized Motivational Interviewing to assist in creating therapeutic repore. The patient in the session was engaged and work in collaboration giving feedback about her triggers and symptoms over the past few weeks. The patient spoke about the impact of  her Mother recently passing away and going through the first few weeks of grieving. The patient spoke about the trauma being a trigger for her Anxiety.The OPT therapist worked with the patient providing support and psycho-education and worked with the patient on grief management. The OPT therapist worked in the session with the patient on challenging negative thoughts and implementing positive thinking. The patient spoke about being focusing on her future and going back to school as a tribute as this is what she felt her Mother would want.   Suicidal/Homicidal: Nowithout intent/plan   Therapist Response: The OPT  therapist worked with the patient for the patients scheduled session. The patient was engaged in her session and gave feedback in relation to triggers, symptoms, and behavior responses over the past few weeks. The OPT therapist worked with the patient utilizing an in session Cognitive Behavioral Therapy exercise. The patient was responsive in the session and verbalized, " Things have just been so overwhelming and its caused my Anxiety to be really bad and I am going to see Dr. Tenny Craw on Friday and let her know I think I need help right now something to take each day for my Anxiety". The OPT therapist worked with the patient on utilizing her support system as she continues to grieve..The OPT therapist worked with the patient on her basic needs areas including sleep, eating habits, exercise, and hygiene.The patient verbalized she is not current having S/I.The OPT therapist will continue treatment work with the patient in her next scheduled session.   Plan: Return again in 2/3 weeks.   Diagnosis:      Axis I: Recurrent moderate,major depression with Anxiety, PTSD, Cannabis Use Disorder                           Axis II: No diagnosis   Collaboration of Care: No additional collaboration for this session.   Patient/Guardian was advised Release of Information must be obtained prior to any record release in order to collaborate their care with an outside provider. Patient/Guardian was advised if they have not already done so to contact the registration department to sign all necessary forms in order for [place] to release information regarding their care.    Consent: Patient/Guardian gives  verbal consent for treatment and assignment of benefits for services provided during this visit. Patient/Guardian expressed understanding and agreed to proceed.      I discussed the assessment and treatment plan with the patient. The patient was provided an opportunity to ask questions and all were answered. The patient agreed  with the plan and demonstrated an understanding of the instructions.   The patient was advised to call back or seek an in-person evaluation if the symptoms worsen or if the condition fails to improve as anticipated.   I provided 55 minutes of non-face-to-face time during this encounter.   Suzan Garibaldi, LCSW   01/22/2022

## 2022-01-25 ENCOUNTER — Encounter (HOSPITAL_COMMUNITY): Payer: Self-pay | Admitting: Psychiatry

## 2022-01-25 ENCOUNTER — Telehealth (INDEPENDENT_AMBULATORY_CARE_PROVIDER_SITE_OTHER): Payer: Medicaid Other | Admitting: Psychiatry

## 2022-01-25 DIAGNOSIS — F4310 Post-traumatic stress disorder, unspecified: Secondary | ICD-10-CM | POA: Diagnosis not present

## 2022-01-25 DIAGNOSIS — F331 Major depressive disorder, recurrent, moderate: Secondary | ICD-10-CM

## 2022-01-25 DIAGNOSIS — F419 Anxiety disorder, unspecified: Secondary | ICD-10-CM | POA: Diagnosis not present

## 2022-01-25 MED ORDER — CLONAZEPAM 1 MG PO TABS: 1.0000 mg | ORAL_TABLET | Freq: Every day | ORAL | 2 refills | Status: DC | PRN

## 2022-01-25 MED ORDER — BUSPIRONE HCL 5 MG PO TABS: 5.0000 mg | ORAL_TABLET | Freq: Three times a day (TID) | ORAL | 3 refills | Status: DC

## 2022-01-25 MED ORDER — PAROXETINE HCL 20 MG PO TABS: 20.0000 mg | ORAL_TABLET | Freq: Every day | ORAL | 2 refills | Status: DC

## 2022-01-25 NOTE — Progress Notes (Signed)
BH MD/PA/NP OP Progress Note  01/25/2022 11:28 AM Chelsea Wade  MRN:  [id number]  Chief Complaint: depression, anxietyVirtual Visit via Video Note  I connected with Chelsea Wade on 01/25/22 at 11:00 AM EDT by a video enabled telemedicine application and verified that I am speaking with the correct person using two identifiers.  Location: Patient: home Provider: office   I discussed the limitations of evaluation and management by telemedicine and the availability of in person appointments. The patient expressed understanding and agreed to proceed.     I discussed the assessment and treatment plan with the patient. The patient was provided an opportunity to ask questions and all were answered. The patient agreed with the plan and demonstrated an understanding of the instructions.   The patient was advised to call back or seek an in-person evaluation if the symptoms worsen or if the condition fails to improve as anticipated.  I provided 20 minutes of non-face-to-face time during this encounter.   Diannia Ruder, MD   HPI: This patient is a 21 year old white female who lives with her boyfriend and several roommates in [place].  She is currently working in a daycare center.  The patient returns for follow-up after about  6 months.  She tells me that her mother passed away in 01-01-2023 after a long illness.  She had been septic after her surgery last November and never really recovered.  Finally in 01/01/2023 the family decided to take her off life support as she was getting worse and had no chance of recovery.  Since then the patient states she has become more depressed and anxious.  She has been compliant with taking the Prozac but it has not been helping.  The Klonopin really is not doing anything for anxiety.  She is having panic attacks almost every day.  She is sleeping fairly well but also has been having a lot of headaches at night as well as panic attacks.  She has restarted her therapy.   She is being active and trying to stay busy.  Her boyfriend is a good support as are other friends.  She enjoys her job and is about to start college at [employer]. Visit Diagnosis:    ICD-10-CM   1. Recurrent moderate major depressive disorder with anxiety (HCC)  F33.1    F41.9     2. PTSD (post-traumatic stress disorder)  F43.10       Past Psychiatric History: The patient has had 2 previous psychiatric hospitalizations and as well as medication trials of Prozac Lexapro Strattera and Focalin XR  Past Medical History:  Past Medical History:  Diagnosis Date   ADHD (attention deficit hyperactivity disorder)    Asthma    Depression    Suicide attempt [facility])     Past Surgical History:  Procedure Laterality Date   TONSILLECTOMY      Family Psychiatric History: see below  Family History:  Family History  Problem Relation Age of Onset   Depression Mother    Bipolar disorder Father    Mental illness Father    Alcohol abuse Father    Schizophrenia Maternal Grandfather    Alcohol abuse Maternal Grandfather     Social History:  Social History   Socioeconomic History   Marital status: Single    Spouse name: Not on file   Number of children: Not on file   Years of education: Not on file   Highest education level: Not on file  Occupational History   Not on  file  Tobacco Use   Smoking status: Every Day    Packs/day: 0.25    Types: Cigarettes   Smokeless tobacco: Never  Vaping Use   Vaping Use: Every day  Substance and Sexual Activity   Alcohol use: Yes    Alcohol/week: 3.0 standard drinks of alcohol    Types: 3 Shots of liquor per week    Comment: occ   Drug use: Yes    Frequency: 3.0 times per week    Types: Marijuana   Sexual activity: Not Currently    Birth control/protection: None  Other Topics Concern   Not on file  Social History Narrative   Lives with mom, step-dad, brother. Step brothers every other weekend.   Social Determinants of  Health   Financial Resource Strain: Not on file  Food Insecurity: Not on file  Transportation Needs: Not on file  Physical Activity: Not on file  Stress: Not on file  Social Connections: Not on file    Allergies: No Known Allergies  Metabolic Disorder Labs: Lab Results  Component Value Date   HGBA1C 5.2 10/02/2017   MPG 102.54 10/02/2017   MPG 108 11/14/2015   Lab Results  Component Value Date   PROLACTIN 57.7 (H) 10/02/2017   Lab Results  Component Value Date   CHOL 145 10/02/2017   TRIG 89 10/02/2017   HDL 39 (L) 10/02/2017   CHOLHDL 3.7 10/02/2017   VLDL 18 10/02/2017   LDLCALC 88 10/02/2017   LDLCALC 53 11/14/2015   Lab Results  Component Value Date   TSH 4.562 10/02/2017   TSH 3.926 11/14/2015    Therapeutic Level Labs: No results found for: "LITHIUM" No results found for: "VALPROATE" No results found for: "CBMZ"  Current Medications: Current Outpatient Medications  Medication Sig Dispense Refill   busPIRone (BUSPAR) 5 MG tablet Take 1 tablet (5 mg total) by mouth 3 (three) times daily. 90 tablet 3   clonazePAM (KLONOPIN) 1 MG tablet Take 1 tablet (1 mg total) by mouth daily as needed for anxiety. 30 tablet 2   PARoxetine (PAXIL) 20 MG tablet Take 1 tablet (20 mg total) by mouth daily. 30 tablet 2   albuterol (VENTOLIN HFA) 108 (90 Base) MCG/ACT inhaler Inhale 1-2 puffs into the lungs every 6 (six) hours as needed for wheezing or shortness of breath. 18 g 0   benzonatate (TESSALON) 100 MG capsule Take 1 capsule (100 mg total) by mouth 3 (three) times daily as needed for cough. 21 capsule 0   cetirizine (ZYRTEC ALLERGY) 10 MG tablet Take 1 tablet (10 mg total) by mouth daily. 30 tablet 0   loratadine (CLARITIN) 10 MG tablet Take 1 tablet (10 mg total) by mouth daily. 30 tablet 0   No current facility-administered medications for this visit.     Musculoskeletal: Strength & Muscle Tone: within normal limits Gait & Station: normal Patient leans:  N/A  Psychiatric Specialty Exam: Review of Systems  Neurological:  Positive for headaches.  Psychiatric/Behavioral:  Positive for dysphoric mood. The patient is nervous/anxious.   All other systems reviewed and are negative.   There were no vitals taken for this visit.There is no height or weight on file to calculate BMI.  General Appearance: Casual, Neat, and Well Groomed  Eye Contact:  Good  Speech:  Clear and Coherent  Volume:  Normal  Mood:  Anxious and Dysphoric  Affect:  Congruent  Thought Process:  Goal Directed  Orientation:  Full (Time, Place, and Person)  Thought Content: Rumination  Suicidal Thoughts:  No  Homicidal Thoughts:  No  Memory:  Immediate;   Good Recent;   Fair Remote;   Good  Judgement:  Good  Insight:  Good  Psychomotor Activity:  Decreased  Concentration:  Concentration: Good and Attention Span: Good  Recall:  Good  Fund of Knowledge: Good  Language: Good  Akathisia:  No  Handed:  Right  AIMS (if indicated): not done  Assets:  Communication Skills Desire for Improvement Physical Health Resilience Social Support Talents/Skills  ADL's:  Intact  Cognition: WNL  Sleep:  Fair   Screenings: AIMS    Flowsheet Row [facility] Ad[facility] ([facility]  AIMS Total Score 0 0      AUDIT    Flowsheet Row [facility]) [facility] Ad[facility] (Dis[facility]  Alcohol Use Disorder Identification Test Final Score (AUDIT) 6 0      GAD-7    Flowsheet Row [facility]  Total GAD-7 Score 20      PHQ2-9    Flowsheet Row Video Visit from [facility]/25/2022 [facility] Video Visit  from [facility]/2021 [facility] Video Visit from 07/02/2021 [facility] Counselor from 06[facility]05/2021 [facility] Video Visit from 06/04/2021 [facility]  PHQ-2 Total Score 3 0 0 6 2  PHQ-9 Total Score 8 -- -- 14 10      Flowsheet Row Video Visit from [facility]06[facility]2021 [facility] ED from [facility]/14/2021 in [facility]  ED from [facility]/01/2021 in [facility]   C-SSRS RISK CATEGORY No Risk No Risk No Risk        Assessment and Plan: This patient is a 21 year old female with a history of depression and anxiety.  She has been struggling since her mother died in 12-18-2022.  She has been med compliant but it does not seem to be helping.  She will start paroxetine 20 mg at bedtime to help with depression and anxiety, BuSpar 5 mg 3 times daily to help with the anxiety as well.  She does not feel comfortable taking more clonazepam but I will increase the dosage to 1 mg to be used for acute anxiety attacks.  She will return to see me in 4 weeks  Collaboration of Care: Collaboration of Care: Referral or follow-up with counselor/therapist AEB patient will continue therapy with Suzan Garibaldi in our office  Patient/Guardian was advised Release of Information must be obtained prior to any record release in order to collaborate their care with an outside provider. Patient/Guardian was advised if they have not already done so to contact the registration department to sign all necessary forms in order for [place] to release information regarding their care.   Consent: Patient/Guardian gives verbal consent for treatment and assignment of benefits for services provided during this visit. Patient/Guardian expressed understanding and agreed to proceed.    Diannia Ruder,  MD 01/25/2022, 11:28 AM

## 2022-02-11 ENCOUNTER — Ambulatory Visit (INDEPENDENT_AMBULATORY_CARE_PROVIDER_SITE_OTHER): Payer: Medicaid Other | Admitting: Clinical

## 2022-02-11 DIAGNOSIS — F419 Anxiety disorder, unspecified: Secondary | ICD-10-CM | POA: Diagnosis not present

## 2022-02-11 DIAGNOSIS — F1210 Cannabis abuse, uncomplicated: Secondary | ICD-10-CM | POA: Diagnosis not present

## 2022-02-11 DIAGNOSIS — F331 Major depressive disorder, recurrent, moderate: Secondary | ICD-10-CM | POA: Diagnosis not present

## 2022-02-11 DIAGNOSIS — F4310 Post-traumatic stress disorder, unspecified: Secondary | ICD-10-CM | POA: Diagnosis not present

## 2022-02-11 NOTE — Progress Notes (Signed)
Virtual Visit via Telephone Note   I connected with Chelsea Wade on 02/11/22 at  1:00 PM EST by telephone and verified that I am speaking with the correct person using two identifiers.   Location: Patient: Home Provider: Office   I discussed the limitations, risks, security and privacy concerns of performing an evaluation and management service by telephone and the availability of in person appointments. I also discussed with the patient that there may be a patient responsible charge related to this service. The patient expressed understanding and agreed to proceed.   THERAPIST PROGRESS NOTE   Session Time: 1:00 PM-1:30 PM   Participation Level: Active   Behavioral Response: CasualAlertDepressed   Type of Therapy: Individual Therapy   Treatment Goals addressed: Mood and Coping   Interventions: CBT, Motivational Interviewing, Solution Focused and Strength-based   Summary: Chelsea Wade is a 21 y.o. female who presents with Depression. The OPT therapist worked with the patient for her ongoing OPT treatment. The OPT therapist utilized Motivational Interviewing to assist in creating therapeutic repore. The patient in the session was engaged and work in collaboration giving feedback about her triggers and symptoms over the past few weeks. The patient spoke about the ongoing impact of losing her Mother, upcoming job change, and started back on her education journey with a full load of classes she will be taking for the Fall.The OPT therapist worked with the patient providing support and psycho-education and worked with the patient on grief management. The OPT therapist worked in the session with the patient on challenging negative thoughts and implementing positive thinking. The patient spoke about being focusing on her future and being proud of herself for taking steps forward with motivation from knowing this would be what her Mother would want.   Suicidal/Homicidal: Nowithout  intent/plan   Therapist Response: The OPT therapist worked with the patient for the patients scheduled session. The patient was engaged in her session and gave feedback in relation to triggers, symptoms, and behavior responses over the past few weeks. The OPT therapist worked with the patient utilizing an in session Cognitive Behavioral Therapy exercise. The patient was responsive in the session and verbalized, " If I am being honest I am proud of myself for taking steps forward and my Mothers passing has motivated me to take the steps I had the tools before but not applying myself and not having the self motivation". The OPT therapist worked with the patient on utilizing her support system as she continues to move forward through her grieving process in the recent loss of her Mother..The OPT therapist worked with the patient on her basic needs areas including sleep, eating habits, exercise, and hygiene.The OPT therapist will continue to work with the patient as she moves through upcoming transitional changes and continue to monitor adjustment, baseline, and MH symptoms.The OPT therapist will continue treatment work with the patient in her next scheduled session.   Plan: Return again in 2/3 weeks.   Diagnosis:      Axis I: Recurrent moderate,major depression with Anxiety, PTSD, Cannabis Use Disorder                           Axis II: No diagnosis   Collaboration of Care: No additional collaboration for this session.   Patient/Guardian was advised Release of Information must be obtained prior to any record release in order to collaborate their care with an outside provider. Patient/Guardian was advised if they have not already  done so to contact the registration department to sign all necessary forms in order for [place] to release information regarding their care.    Consent: Patient/Guardian gives verbal consent for treatment and assignment of benefits for services provided during this visit.  Patient/Guardian expressed understanding and agreed to proceed.      I discussed the assessment and treatment plan with the patient. The patient was provided an opportunity to ask questions and all were answered. The patient agreed with the plan and demonstrated an understanding of the instructions.   The patient was advised to call back or seek an in-person evaluation if the symptoms worsen or if the condition fails to improve as anticipated.   I provided 30 minutes of non-face-to-face time during this encounter.   Suzan Garibaldi, LCSW   02/11/2022

## 2022-02-11 NOTE — Plan of Care (Signed)
Verbal Consent 

## 2022-02-22 DIAGNOSIS — Z419 Encounter for procedure for purposes other than remedying health state, unspecified: Secondary | ICD-10-CM | POA: Diagnosis not present

## 2022-03-11 ENCOUNTER — Ambulatory Visit (INDEPENDENT_AMBULATORY_CARE_PROVIDER_SITE_OTHER): Payer: Medicaid Other | Admitting: Clinical

## 2022-03-11 DIAGNOSIS — F1210 Cannabis abuse, uncomplicated: Secondary | ICD-10-CM

## 2022-03-11 DIAGNOSIS — F4310 Post-traumatic stress disorder, unspecified: Secondary | ICD-10-CM

## 2022-03-11 DIAGNOSIS — F419 Anxiety disorder, unspecified: Secondary | ICD-10-CM

## 2022-03-11 DIAGNOSIS — F331 Major depressive disorder, recurrent, moderate: Secondary | ICD-10-CM

## 2022-03-11 NOTE — Progress Notes (Signed)
Virtual Visit via Telephone Note   I connected with Chelsea Wade on 03/11/22 at  4:00 PM EST by telephone and verified that I am speaking with the correct person using two identifiers.   Location: Patient: Home Provider: Office   I discussed the limitations, risks, security and privacy concerns of performing an evaluation and management service by telephone and the availability of in person appointments. I also discussed with the patient that there may be a patient responsible charge related to this service. The patient expressed understanding and agreed to proceed.   THERAPIST PROGRESS NOTE   Session Time: 4:00 PM-4:30 PM   Participation Level: Active   Behavioral Response: CasualAlertDepressed   Type of Therapy: Individual Therapy   Treatment Goals addressed: Mood and Coping   Interventions: CBT, Motivational Interviewing, Solution Focused and Strength-based   Summary: Chelsea Wade is a 21 y.o. female who presents with Depression. The OPT therapist worked with the patient for her ongoing OPT treatment. The OPT therapist utilized Motivational Interviewing to assist in creating therapeutic repore. The patient in the session was engaged and work in collaboration giving feedback about her triggers and symptoms over the past few weeks. The patient spoke about the ongoing impact of losing her Mother, starting a new job, her education journey with a full load of classes difficulty recently with financial aid coverage, and learning over the weekend she has COVID.The OPT therapist worked with the patient providing support and psycho-education and worked with the patient on grief management. The OPT therapist worked in the session with the patient on challenging negative thoughts and implementing positive thinking. The patient spoke about being focusing on her future and being proud of herself for taking steps forward with motivation from knowing this would be what her Mother would want. 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   Suicidal/Homicidal: Nowithout intent/plan   Therapist Response: The OPT therapist worked with the patient for the patients scheduled session. The patient was engaged in her session and gave feedback in relation to triggers, symptoms, and behavior responses over the past few weeks. The OPT therapist worked with the patient utilizing an in session Cognitive Behavioral Therapy exercise. The patient was responsive in the session and verbalized, " I finally after several steps got financial aid coverage and I can return to work this Thursday ". The OPT therapist worked with the patient on utilizing her support system as she continues to move forward through her grieving process in the recent loss of her Mother and recovering from [place].The OPT therapist worked with the patient on her basic needs areas including sleep, eating habits, exercise, and hygiene.The OPT therapist will continue treatment work with the patient in her next scheduled session.   Plan: Return again in 2/3 weeks.   Diagnosis:      Axis I: Recurrent moderate,major depression with Anxiety, PTSD, Cannabis Use Disorder                           Axis II: No diagnosis   Collaboration of Care: No additional collaboration for this session.   Patient/Guardian was advised Release of Information must be obtained prior to any record release in order to collaborate their care with an outside provider. Patient/Guardian was advised if they have not already done so to contact the registration department to sign all necessary forms  in order for [place] to release information regarding their care.    Consent: Patient/Guardian gives verbal consent for treatment and assignment of benefits for services provided during this visit. Patient/Guardian expressed understanding and agreed to proceed.      I discussed the  assessment and treatment plan with the patient. The patient was provided an opportunity to ask questions and all were answered. The patient agreed with the plan and demonstrated an understanding of the instructions.   The patient was advised to call back or seek an in-person evaluation if the symptoms worsen or if the condition fails to improve as anticipated.   I provided 30 minutes of non-face-to-face time during this encounter.   Maye Hides, LCSW   03/11/2022

## 2022-03-24 DIAGNOSIS — Z419 Encounter for procedure for purposes other than remedying health state, unspecified: Secondary | ICD-10-CM | POA: Diagnosis not present

## 2022-04-24 DIAGNOSIS — Z419 Encounter for procedure for purposes other than remedying health state, unspecified: Secondary | ICD-10-CM | POA: Diagnosis not present

## 2022-04-24 DIAGNOSIS — Z30011 Encounter for initial prescription of contraceptive pills: Secondary | ICD-10-CM | POA: Diagnosis not present

## 2022-05-24 DIAGNOSIS — Z419 Encounter for procedure for purposes other than remedying health state, unspecified: Secondary | ICD-10-CM | POA: Diagnosis not present

## 2022-06-07 ENCOUNTER — Ambulatory Visit
Admission: EM | Admit: 2022-06-07 | Discharge: 2022-06-07 | Disposition: A | Discharge status: Home / Self Care | Payer: Medicaid Other | Attending: Internal Medicine | Admitting: Internal Medicine

## 2022-06-07 DIAGNOSIS — R509 Fever, unspecified: Secondary | ICD-10-CM

## 2022-06-07 DIAGNOSIS — J101 Influenza due to other identified influenza virus with other respiratory manifestations: Secondary | ICD-10-CM | POA: Diagnosis not present

## 2022-06-07 DIAGNOSIS — J4521 Mild intermittent asthma with (acute) exacerbation: Secondary | ICD-10-CM

## 2022-06-07 LAB — POCT INFLUENZA A/B
Influenza A, POC: POSITIVE — AB
Influenza B, POC: NEGATIVE

## 2022-06-07 MED ORDER — PREDNISONE 20 MG PO TABS
40.0000 mg | ORAL_TABLET | Freq: Every day | ORAL | 0 refills | Status: AC
Start: 2022-06-07 — End: 2022-06-12

## 2022-06-07 MED ORDER — BENZONATATE 100 MG PO CAPS
100.0000 mg | ORAL_CAPSULE | Freq: Three times a day (TID) | ORAL | 0 refills | Status: AC | PRN
Start: 2022-06-07 — End: ?

## 2022-06-07 MED ORDER — ACETAMINOPHEN 325 MG PO TABS
650.0000 mg | ORAL_TABLET | Freq: Once | ORAL | Status: AC
  Administered 2022-06-07: 650 mg via ORAL

## 2022-06-07 MED ORDER — OSELTAMIVIR PHOSPHATE 75 MG PO CAPS: 75.0000 mg | ORAL_CAPSULE | Freq: Two times a day (BID) | ORAL | 0 refills | Status: DC

## 2022-06-07 MED ORDER — IBUPROFEN 800 MG PO TABS
800.0000 mg | ORAL_TABLET | Freq: Once | ORAL | Status: AC
  Administered 2022-06-07: 800 mg via ORAL

## 2022-06-07 MED ORDER — ALBUTEROL SULFATE HFA 108 (90 BASE) MCG/ACT IN AERS: 1.0000 | INHALATION_SPRAY | Freq: Four times a day (QID) | RESPIRATORY_TRACT | 0 refills | Status: AC | PRN

## 2022-06-07 MED ORDER — IPRATROPIUM-ALBUTEROL 0.5-2.5 (3) MG/3ML IN SOLN
3.0000 mL | Freq: Once | RESPIRATORY_TRACT | Status: AC
  Administered 2022-06-07: 3 mL via RESPIRATORY_TRACT

## 2022-06-07 NOTE — ED Triage Notes (Signed)
[facility] presents to [facility] with [facility] of cough congestion generalized body aches and pains and nausea and vomiting since Wednesday. Pt reports multiple otc medications for symptoms with minimal improvement

## 2022-06-07 NOTE — ED Provider Notes (Signed)
[facility]    CSN: [id number] Arrival date & time: 06/07/22  1650      History   Chief Complaint Chief Complaint  Patient presents with   URI    HPI Chelsea Wade is a 21 y.o. female.   Patient presents with nasal congestion, cough, chills, sore throat, generalized bodyaches, nausea, vomiting that started about 2 days ago.  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  Patient is not sure of Tmax at home but states that she has felt feverish.  Patient reports history of asthma and has been having shortness of breath and feelings of chest tightness.  She does not have albuterol inhaler at home.  Denies chest pain, diarrhea, blood in stool or emesis, abdominal pain.  Patient has taken over-the-counter cold and flu medications as well as Tylenol with last dose being yesterday with minimal improvement of symptoms.   URI   Past Medical History:  Diagnosis Date   ADHD (attention deficit hyperactivity disorder)    Asthma    Depression    Suicide attempt [facility])     Patient Active Problem List   Diagnosis Date Noted   MDD (major depressive disorder), severe (HCC) 09/30/2017   MDD (major depressive disorder), recurrent episode, severe (HCC) 11/12/2015   Drug overdose 11/10/2015   Intentional overdose of drug in tablet form (HCC) 11/10/2015   Convulsions/seizures (HCC) 11/10/2015    Past Surgical History:  Procedure Laterality Date   TONSILLECTOMY      OB History   No obstetric history on file.      Home Medications    Prior to Admission medications   Medication Sig Start Date End Date Taking? Authorizing Provider  albuterol (VENTOLIN HFA) 108 (90 Base) MCG/ACT inhaler Inhale 1-2 puffs into the lungs every 6 (six) hours as needed for wheezing or shortness of breath. 06/07/22  Yes , Rolly Salter E, FNP  benzonatate (TESSALON) 100 MG capsule Take 1 capsule (100 mg total) by mouth every 8 (eight) hours as  needed for cough. 06/07/22  Yes , Acie Fredrickson, FNP  oseltamivir (TAMIFLU) 75 MG capsule Take 1 capsule (75 mg total) by mouth every 12 (twelve) hours. 06/07/22  Yes , Rolly Salter E, FNP  predniSONE (DELTASONE) 20 MG tablet Take 2 tablets (40 mg total) by mouth daily for 5 days. 06/07/22 06/12/22 Yes , Acie Fredrickson, FNP  albuterol (VENTOLIN HFA) 108 (90 Base) MCG/ACT inhaler Inhale 1-2 puffs into the lungs every 6 (six) hours as needed for wheezing or shortness of breath. 08/14/21   Mickie Bail, NP  busPIRone (BUSPAR) 5 MG tablet Take 1 tablet (5 mg total) by mouth 3 (three) times daily. 01/25/22   Myrlene Broker, MD  cetirizine (ZYRTEC ALLERGY) 10 MG tablet Take 1 tablet (10 mg total) by mouth daily. 08/01/21   Wallis Bamberg, PA-C  clonazePAM (KLONOPIN) 1 MG tablet Take 1 tablet (1 mg total) by mouth daily as needed for anxiety. 01/25/22 01/25/23  Myrlene Broker, MD  loratadine (CLARITIN) 10 MG tablet Take 1 tablet (10 mg total) by mouth daily. 08/01/21   Wallis Bamberg, PA-C  PARoxetine (PAXIL) 20 MG tablet Take 1 tablet (20 mg total) by mouth daily. 01/25/22 01/25/23  Myrlene Broker, MD    Family History Family History  Problem Relation Age of Onset   Depression Mother    Bipolar disorder Father    Mental illness Father    Alcohol abuse  Father    Schizophrenia Maternal Grandfather    Alcohol abuse Maternal Grandfather     Social History Social History   Tobacco Use   Smoking status: Every Day    Packs/day: 0.25    Types: Cigarettes   Smokeless tobacco: Never  Vaping Use   Vaping Use: Every day  Substance Use Topics   Alcohol use: Yes    Alcohol/week: 3.0 standard drinks of alcohol    Types: 3 Shots of liquor per week    Comment: occ   Drug use: Yes    Frequency: 3.0 times per week    Types: Marijuana     Allergies   Patient has no known allergies.   Review of Systems Review of Systems Per HPI  Physical Exam Triage Vital Signs ED Triage Vitals  Enc Vitals Group     BP  06/07/22 1745 119/81     Pulse Rate 06/07/22 1745 (!) 120     Resp 06/07/22 1745 (!) 22     Temp 06/07/22 1745 (!) 101.6 F (38.7 C)     Temp Source 06/07/22 1745 Oral     SpO2 06/07/22 1745 94 %     Weight --      Height --      Head Circumference --      Peak Flow --      Pain Score 06/07/22 1748 5     Pain Loc --      Pain Edu? --      Excl. in GC? --    No data found.  Updated Vital Signs BP 119/81 (BP Location: Left Arm)   Pulse (!) 119   Temp 99.4 F (37.4 C)   Resp (!) 22   SpO2 95%   Visual Acuity Right Eye Distance:   Left Eye Distance:   Bilateral Distance:    Right Eye Near:   Left Eye Near:    Bilateral Near:     Physical Exam Constitutional:      General: She is not in acute distress.    Appearance: Normal appearance. She is not toxic-appearing or diaphoretic.  HENT:     Head: Normocephalic and atraumatic.     Right Ear: Tympanic membrane and ear canal normal.     Left Ear: Tympanic membrane and ear canal normal.     Nose: Congestion present.     Mouth/Throat:     Mouth: Mucous membranes are moist.     Pharynx: Posterior oropharyngeal erythema present.  Eyes:     Extraocular Movements: Extraocular movements intact.     Conjunctiva/sclera: Conjunctivae normal.     Pupils: Pupils are equal, round, and reactive to light.  Cardiovascular:     Rate and Rhythm: Normal rate and regular rhythm.     Pulses: Normal pulses.     Heart sounds: Normal heart sounds.  Pulmonary:     Effort: Pulmonary effort is normal. No respiratory distress.     Breath sounds: No stridor. Wheezing present. No rhonchi or rales.     Comments: Mild wheezing noted bilaterally. Abdominal:     General: Abdomen is flat. Bowel sounds are normal.     Palpations: Abdomen is soft.  Musculoskeletal:        General: Normal range of motion.     Cervical back: Normal range of motion.  Skin:    General: Skin is warm and dry.  Neurological:     General: No focal deficit present.      Mental Status: She  is alert and oriented to person, place, and time. Mental status is at baseline.  Psychiatric:        Mood and Affect: Mood normal.        Behavior: Behavior normal.      UC Treatments / Results  Labs (all labs ordered are listed, but only abnormal results are displayed) Labs Reviewed  POCT INFLUENZA A/B - Abnormal; Notable for the following components:      Result Value   Influenza A, POC Positive (*)    All other components within normal limits    EKG   Radiology No results found.  Procedures Procedures (including critical care time)  Medications Ordered in UC Medications  acetaminophen (TYLENOL) tablet 650 mg (650 mg Oral Given 06/07/22 1800)  ipratropium-albuterol (DUONEB) 0.5-2.5 (3) MG/3ML nebulizer solution 3 mL (3 mLs Nebulization Given 06/07/22 1800)  ibuprofen (ADVIL) tablet 800 mg (800 mg Oral Given 06/07/22 1850)    Initial Impression / Assessment and Plan / UC Course  I have reviewed the triage vital signs and the nursing notes.  Pertinent labs & imaging results that were available during my care of the patient were reviewed by me and considered in my medical decision making (see chart for details).     Patient tested positive for influenza.  Will treat with Tamiflu.  Patient had mildly low oxygen saturation.  DuoNeb administered with improvement in oxygen saturation and it sustained at 95 to 96%.  Patient had fever with increased heart rate as well.  Tylenol and ibuprofen administered with improvement in fever and heart rate.  Patient reports that her normal heart rate is 110s to 120s and she is currently nervous.  Do not think that any further workup for tachycardia is necessary at this time due to this.  Albuterol could be contributing to an elevated heart rate as well.  Therefore, patient was advised to monitor heart rate and oxygen very closely at home with pulse oximeter and follow-up if it remains elevated.  Benzonatate prescribed to take  as needed for cough.  Suspect asthma exacerbation due to flu.  Prednisone and albuterol sent for patient due to this.  Patient advised to use NSAIDs sparingly while taking prednisone.  Patient was given strict return and ER precautions.  Patient's breathing, oxygen, other vital signs stable prior to discharge.  Patient is safe for discharge.  Patient verbalized understanding and was agreeable with plan. Final Clinical Impressions(s) / UC Diagnoses   Final diagnoses:  Influenza A  Mild intermittent asthma with acute exacerbation  Fever, unspecified     Discharge Instructions      You have the flu.  I am prescribing Tamiflu to help treat this.  It also appears that your asthma is being flared up by this so I am treating this with albuterol and prednisone.  Please get a pulse oximeter from the pharmacy when you pick up your medications and monitor your oxygen and heart rate very closely.  Follow-up at the ER if your oxygen drops below 93% sustains or if your heart rate remains elevated.     ED Prescriptions     Medication Sig Dispense Auth. Provider   oseltamivir (TAMIFLU) 75 MG capsule Take 1 capsule (75 mg total) by mouth every 12 (twelve) hours. 10 capsule [place], Rolly Salter E, [place]   predniSONE (DELTASONE) 20 MG tablet Take 2 tablets (40 mg total) by mouth daily for 5 days. 10 tablet [place], [place] E, [place]   benzonatate (TESSALON) 100 MG capsule Take 1  capsule (100 mg total) by mouth every 8 (eight) hours as needed for cough. 21 capsule [place] [facility] [place], [place] E, [place]   albuterol (VENTOLIN HFA) 108 (90 Base) MCG/ACT inhaler Inhale 1-2 puffs into the lungs every 6 (six) hours as needed for wheezing or shortness of breath. 1 each Gustavus Bryant, [place]      PDMP not reviewed this encounter.   Gustavus Bryant, [place] 06/07/22 2002

## 2022-06-07 NOTE — Discharge Instructions (Signed)
You have the flu.  I am prescribing Tamiflu to help treat this.  It also appears that your asthma is being flared up by this so I am treating this with albuterol and prednisone.  Please get a pulse oximeter from the pharmacy when you pick up your medications and monitor your oxygen and heart rate very closely.  Follow-up at the ER if your oxygen drops below 93% sustains or if your heart rate remains elevated.

## 2022-06-08 DIAGNOSIS — R062 Wheezing: Secondary | ICD-10-CM | POA: Diagnosis not present

## 2022-06-08 DIAGNOSIS — J101 Influenza due to other identified influenza virus with other respiratory manifestations: Secondary | ICD-10-CM | POA: Diagnosis not present

## 2022-06-08 DIAGNOSIS — Z20822 Contact with and (suspected) exposure to covid-19: Secondary | ICD-10-CM | POA: Diagnosis not present

## 2022-06-08 DIAGNOSIS — R0602 Shortness of breath: Secondary | ICD-10-CM | POA: Diagnosis not present

## 2022-06-09 DIAGNOSIS — R000 Tachycardia, unspecified: Secondary | ICD-10-CM | POA: Diagnosis not present

## 2022-06-09 DIAGNOSIS — R0602 Shortness of breath: Secondary | ICD-10-CM | POA: Diagnosis not present

## 2022-06-24 DIAGNOSIS — Z419 Encounter for procedure for purposes other than remedying health state, unspecified: Secondary | ICD-10-CM | POA: Diagnosis not present

## 2022-07-25 DIAGNOSIS — Z419 Encounter for procedure for purposes other than remedying health state, unspecified: Secondary | ICD-10-CM | POA: Diagnosis not present

## 2022-08-02 DIAGNOSIS — Z3202 Encounter for pregnancy test, result negative: Secondary | ICD-10-CM | POA: Diagnosis not present

## 2022-08-02 DIAGNOSIS — Z1321 Encounter for screening for nutritional disorder: Secondary | ICD-10-CM | POA: Diagnosis not present

## 2022-08-02 DIAGNOSIS — Z131 Encounter for screening for diabetes mellitus: Secondary | ICD-10-CM | POA: Diagnosis not present

## 2022-08-02 DIAGNOSIS — Z13228 Encounter for screening for other metabolic disorders: Secondary | ICD-10-CM | POA: Diagnosis not present

## 2022-08-02 DIAGNOSIS — R197 Diarrhea, unspecified: Secondary | ICD-10-CM | POA: Diagnosis not present

## 2022-08-02 DIAGNOSIS — R1084 Generalized abdominal pain: Secondary | ICD-10-CM | POA: Diagnosis not present

## 2022-08-07 DIAGNOSIS — N390 Urinary tract infection, site not specified: Secondary | ICD-10-CM | POA: Diagnosis not present

## 2022-08-07 DIAGNOSIS — N760 Acute vaginitis: Secondary | ICD-10-CM | POA: Diagnosis not present

## 2022-08-23 DIAGNOSIS — Z419 Encounter for procedure for purposes other than remedying health state, unspecified: Secondary | ICD-10-CM | POA: Diagnosis not present

## 2022-08-27 ENCOUNTER — Encounter (HOSPITAL_COMMUNITY): Payer: Self-pay | Admitting: Psychiatry

## 2022-08-27 ENCOUNTER — Telehealth (INDEPENDENT_AMBULATORY_CARE_PROVIDER_SITE_OTHER): Payer: Medicaid Other | Admitting: Psychiatry

## 2022-08-27 DIAGNOSIS — F4310 Post-traumatic stress disorder, unspecified: Secondary | ICD-10-CM | POA: Diagnosis not present

## 2022-08-27 DIAGNOSIS — F331 Major depressive disorder, recurrent, moderate: Secondary | ICD-10-CM | POA: Diagnosis not present

## 2022-08-27 DIAGNOSIS — F419 Anxiety disorder, unspecified: Secondary | ICD-10-CM | POA: Diagnosis not present

## 2022-08-27 MED ORDER — BUSPIRONE HCL 5 MG PO TABS: 5.0000 mg | ORAL_TABLET | Freq: Three times a day (TID) | ORAL | 3 refills | Status: DC

## 2022-08-27 MED ORDER — CLONAZEPAM 1 MG PO TABS: 1.0000 mg | ORAL_TABLET | Freq: Every day | ORAL | 2 refills | Status: DC | PRN

## 2022-08-27 MED ORDER — PAROXETINE HCL 20 MG PO TABS: 20.0000 mg | ORAL_TABLET | Freq: Every day | ORAL | 2 refills | Status: DC

## 2022-08-27 MED ORDER — ARIPIPRAZOLE 5 MG PO TABS: 5.0000 mg | ORAL_TABLET | Freq: Every day | ORAL | 2 refills | Status: DC

## 2022-08-27 NOTE — Progress Notes (Signed)
Virtual Visit via Video Note  I connected with Chelsea Wade on 08/27/22 at  9:00 AM EST by a video enabled telemedicine application and verified that I am speaking with the correct person using two identifiers.  Location: Patient: home Provider: office   I discussed the limitations of evaluation and management by telemedicine and the availability of in person appointments. The patient expressed understanding and agreed to proceed.      I discussed the assessment and treatment plan with the patient. The patient was provided an opportunity to ask questions and all were answered. The patient agreed with the plan and demonstrated an understanding of the instructions.   The patient was advised to call back or seek an in-person evaluation if the symptoms worsen or if the condition fails to improve as anticipated.  I provided 20 minutes of non-face-to-face time during this encounter.   Levonne Spiller, MD  [facility] MD/PA/NP OP Progress Note  08/27/2022 9:18 AM Chelsea Wade  MRN:  [id number]  Chief Complaint:  Chief Complaint  Patient presents with   Depression   Anxiety   Follow-up   HPI: This patient is a 22 year old white female who lives with her boyfriend now in [place].  She is not working right now but attending classes at [employer].  She plans to go into cosmetology.  The patient returns for follow-up after about 6 months.  She states that a lot of positive things have happened.  She and her boyfriend have gotten their own apartment.  They are now engaged.  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  She states that over the last couple of months she has had a lot of anger outbursts.  She is not really sure why she thinks it may still be related to the refill for her mom's passing away last June.  She states that it is almost a daily basis and it is very difficult to control.  She does note that she lost the BuSpar prescription in the mood  but still is taking the Paxil.  She very rarely needs the Klonopin as her anxiety seems to be somewhat better.  I suggested that the anxiety may be part of the anger and we need to reinstate the BuSpar but also add a mood stabilizer such as Abilify and she is in agreement.  She is also going to return to therapy. Visit Diagnosis:    ICD-10-CM   1. Recurrent moderate major depressive disorder with anxiety (HCC)  F33.1    F41.9     2. PTSD (post-traumatic stress disorder)  F43.10       Past Psychiatric History:  The patient has had 2 previous psychiatric hospitalizations and as well as medication trials of Prozac Lexapro Strattera and Focalin XR   Past Medical History:  Past Medical History:  Diagnosis Date   ADHD (attention deficit hyperactivity disorder)    Asthma    Depression    Suicide attempt [facility])     Past Surgical History:  Procedure Laterality Date   TONSILLECTOMY      Family Psychiatric History: See below  Family History:  Family History  Problem Relation Age of Onset   Depression Mother    Bipolar disorder Father    Mental illness Father    Alcohol abuse Father    Schizophrenia Maternal Grandfather    Alcohol abuse Maternal Grandfather     Social History:  Social History   Socioeconomic History   Marital status: Single  Spouse name: Not on file   Number of children: Not on file   Years of education: Not on file   Highest education level: Not on file  Occupational History   Not on file  Tobacco Use   Smoking status: Every Day    Packs/day: 0.25    Types: Cigarettes   Smokeless tobacco: Never  Vaping Use   Vaping Use: Every day  Substance and Sexual Activity   Alcohol use: Yes    Alcohol/week: 3.0 standard drinks of alcohol    Types: 3 Shots of liquor per week    Comment: occ   Drug use: Yes    Frequency: 3.0 times per week    Types: Marijuana   Sexual activity: Not Currently    Birth control/protection: None  Other Topics Concern   Not on  file  Social History Narrative   Lives with mom, step-dad, brother. Step brothers every other weekend.   Social Determinants of Health   Financial Resource Strain: Not on file  Food Insecurity: Not on file  Transportation Needs: Not on file  Physical Activity: Not on file  Stress: Not on file  Social Connections: Not on file    Allergies: No Known Allergies  Metabolic Disorder Labs: Lab Results  Component Value Date   HGBA1C 5.2 10/02/2017   MPG 102.54 10/02/2017   MPG 108 11/14/2015   Lab Results  Component Value Date   PROLACTIN 57.7 (H) 10/02/2017   Lab Results  Component Value Date   CHOL 145 10/02/2017   TRIG 89 10/02/2017   HDL 39 (L) 10/02/2017   CHOLHDL 3.7 10/02/2017   VLDL 18 10/02/2017   LDLCALC 88 10/02/2017   LDLCALC 53 11/14/2015   Lab Results  Component Value Date   TSH 4.562 10/02/2017   TSH 3.926 11/14/2015    Therapeutic Level Labs: No results found for: "LITHIUM" No results found for: "VALPROATE" No results found for: "CBMZ"  Current Medications: Current Outpatient Medications  Medication Sig Dispense Refill   ARIPiprazole (ABILIFY) 5 MG tablet Take 1 tablet (5 mg total) by mouth daily. 30 tablet 2   albuterol (VENTOLIN HFA) 108 (90 Base) MCG/ACT inhaler Inhale 1-2 puffs into the lungs every 6 (six) hours as needed for wheezing or shortness of breath. 18 g 0   albuterol (VENTOLIN HFA) 108 (90 Base) MCG/ACT inhaler Inhale 1-2 puffs into the lungs every 6 (six) hours as needed for wheezing or shortness of breath. 1 each 0   benzonatate (TESSALON) 100 MG capsule Take 1 capsule (100 mg total) by mouth every 8 (eight) hours as needed for cough. 21 capsule 0   busPIRone (BUSPAR) 5 MG tablet Take 1 tablet (5 mg total) by mouth 3 (three) times daily. 90 tablet 3   cetirizine (ZYRTEC ALLERGY) 10 MG tablet Take 1 tablet (10 mg total) by mouth daily. 30 tablet 0   clonazePAM (KLONOPIN) 1 MG tablet Take 1 tablet (1 mg total) by mouth daily as needed for  anxiety. 30 tablet 2   loratadine (CLARITIN) 10 MG tablet Take 1 tablet (10 mg total) by mouth daily. 30 tablet 0   oseltamivir (TAMIFLU) 75 MG capsule Take 1 capsule (75 mg total) by mouth every 12 (twelve) hours. 10 capsule 0   PARoxetine (PAXIL) 20 MG tablet Take 1 tablet (20 mg total) by mouth daily. 30 tablet 2   No current facility-administered medications for this visit.     Musculoskeletal: Strength & Muscle Tone: within normal limits Gait & Station:  normal Patient leans: N/A  Psychiatric Specialty Exam: Review of Systems  Psychiatric/Behavioral:  Positive for agitation.   All other systems reviewed and are negative.   There were no vitals taken for this visit.There is no height or weight on file to calculate BMI.  General Appearance: Casual and Fairly Groomed  Eye Contact:  Good  Speech:  Clear and Coherent  Volume:  Normal  Mood:  Angry and Irritable  Affect:  Congruent  Thought Process:  Goal Directed  Orientation:  Full (Time, Place, and Person)  Thought Content: Rumination   Suicidal Thoughts:  No  Homicidal Thoughts:  No  Memory:  Immediate;   Good Recent;   Good Remote;   NA  Judgement:  Good  Insight:  Fair  Psychomotor Activity:  Normal  Concentration:  Concentration: Good and Attention Span: Good  Recall:  Good  Fund of Knowledge: Good  Language: Good  Akathisia:  No  Handed:  Right  AIMS (if indicated): not done  Assets:  Communication Skills Desire for Improvement Physical Health Resilience Social Support Talents/Skills Vocational/Educational  ADL's:  Intact  Cognition: WNL  Sleep:  Good   Screenings: AIMS    Flowsheet Row Admission (Discharged) from 09/30/2017 in [place] Admission (Discharged) from 11/12/2015 in [place] Total Score 0 0      AUDIT    Flowsheet Row Admission (Discharged) from 09/30/2017 in [place]  Admission (Discharged) from 11/12/2015 in [place] CHILD/[facility]OL[facility] 100B  Alcohol Use Disorder Identification Test Final Score (AUDIT) 6 0      GAD-7    Flowsheet Row Counselor from 06/05/2021 in [place] [place] [facility] [place]  Total GAD-7 Score 20      PHQ2-9    Flowsheet Row Video Visit from 08/27/2022 in [place] [facility] [place] Video Visit from 01/25/2022 in [place] [facility] [place] Video Visit from 11/06/2021 in [place] [facility] [place] Video Visit from 07/02/2021 in [place] [facility] [place] from 06/05/2021 in [place] at [facility] Total Score 1 3 0 0 6  PHQ-9 Total Score -- 8 -- -- 14      Flowsheet Row Video Visit from 08/27/2022 in [place] at [place] [facility] from 06/07/2022 in [facility]) Video Visit from 11/06/2021 in [place] at [place] No Risk No Risk No Risk        Assessment and Plan: This patient is a 22 year old female with a history of depression and anxiety.  We will add Abilify 5 mg daily as a mood stabilizer.  She will continue paroxetine 20 mg at bedtime for depression and anxiety BuSpar 5 mg 3 times daily for anxiety and clonazepam 1 mg only as needed daily for anxiety attacks.  She will return to see me in 6 weeks  Collaboration of Care: Collaboration of Care: Referral or follow-up with counselor/therapist AEB patient will restart therapy with Maye Hides in our office  Patient/Guardian was advised Release of Information must be obtained prior to any record release in order to collaborate their care with an outside provider. Patient/Guardian was advised if they have not already done so to contact the registration department to sign all  necessary forms in order for [place] to release information regarding  their care.   Consent: Patient/Guardian gives verbal consent for treatment and assignment of benefits for services provided during this visit. Patient/Guardian expressed understanding and agreed to proceed.    Levonne Spiller, MD 08/27/2022, 9:18 AM

## 2022-09-06 ENCOUNTER — Ambulatory Visit (INDEPENDENT_AMBULATORY_CARE_PROVIDER_SITE_OTHER): Payer: Medicaid Other | Admitting: Clinical

## 2022-09-06 ENCOUNTER — Encounter (HOSPITAL_COMMUNITY): Payer: Self-pay

## 2022-09-06 DIAGNOSIS — F331 Major depressive disorder, recurrent, moderate: Secondary | ICD-10-CM

## 2022-09-06 DIAGNOSIS — F4310 Post-traumatic stress disorder, unspecified: Secondary | ICD-10-CM | POA: Diagnosis not present

## 2022-09-06 DIAGNOSIS — F419 Anxiety disorder, unspecified: Secondary | ICD-10-CM | POA: Diagnosis not present
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
## 2022-09-23 DIAGNOSIS — Z419 Encounter for procedure for purposes other than remedying health state, unspecified: Secondary | ICD-10-CM | POA: Diagnosis not present

## 2022-09-30 ENCOUNTER — Telehealth (HOSPITAL_COMMUNITY): Payer: Medicaid Other | Admitting: Psychiatry

## 2022-10-01 ENCOUNTER — Telehealth (INDEPENDENT_AMBULATORY_CARE_PROVIDER_SITE_OTHER): Payer: Medicaid Other | Admitting: Psychiatry

## 2022-10-01 ENCOUNTER — Encounter (HOSPITAL_COMMUNITY): Payer: Self-pay | Admitting: Psychiatry

## 2022-10-01 DIAGNOSIS — F419 Anxiety disorder, unspecified: Secondary | ICD-10-CM

## 2022-10-01 DIAGNOSIS — F4310 Post-traumatic stress disorder, unspecified: Secondary | ICD-10-CM | POA: Diagnosis not present

## 2022-10-01 DIAGNOSIS — F331 Major depressive disorder, recurrent, moderate: Secondary | ICD-10-CM

## 2022-10-01 MED ORDER — ARIPIPRAZOLE 5 MG PO TABS: 5.0000 mg | ORAL_TABLET | Freq: Every day | ORAL | 2 refills | Status: DC

## 2022-10-01 MED ORDER — PAROXETINE HCL 20 MG PO TABS: 20.0000 mg | ORAL_TABLET | Freq: Every day | ORAL | 2 refills | Status: DC

## 2022-10-01 MED ORDER — BUSPIRONE HCL 5 MG PO TABS
5.0000 mg | ORAL_TABLET | Freq: Three times a day (TID) | ORAL | 3 refills | Status: DC
Start: 2022-10-01 — End: 2022-11-25

## 2022-10-01 MED ORDER — CLONAZEPAM 1 MG PO TABS: 1.0000 mg | ORAL_TABLET | Freq: Every day | ORAL | 2 refills | Status: DC | PRN

## 2022-10-01 NOTE — Progress Notes (Signed)
Virtual Visit via Video Note  I connected with Chelsea Wade on 10/01/22 at  3:40 PM EDT by a video enabled telemedicine application and verified that I am speaking with the correct person using two identifiers.  Location: Patient: home Provider: office   I discussed the limitations of evaluation and management by telemedicine and the availability of in person appointments. The patient expressed understanding and agreed to proceed.    I discussed the assessment and treatment plan with the patient. The patient was provided an opportunity to ask questions and all were answered. The patient agreed with the plan and demonstrated an understanding of the instructions.   The patient was advised to call back or seek an in-person evaluation if the symptoms worsen or if the condition fails to improve as anticipated.  I provided 15 minutes of non-face-to-face time during this encounter.   Chelsea Rudereborah [name], MD  [facility] MD/PA/NP OP Progress Note  10/01/2022 3:55 PM Chelsea Wade  MRN:  [id number]  Chief Complaint:  Chief Complaint  Patient presents with   Depression   Anxiety   Follow-up   HPI: This patient is a 22 year old white female who lives with her boyfriend now in [place]. She is not working right now but attending classes at [employer]. She plans to go into cosmetology.   The patient returns for follow-up after 4 weeks.  Last time she was having a lot of anger and irritability.  We added Abilify to her regimen and this seems to have helped quite a bit.  She is less angry and irritable now.  We also reinstated the BuSpar which is helping her anxiety.  She still uses the Paxil daily to help with depression and anxiety and only rarely uses Klonopin.  Overall she feels like her mood has stabilized.  She and her boyfriend got a new puppy which is giving her something to focus on.  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 Visit Diagnosis:     ICD-10-CM   1. Recurrent moderate major depressive disorder with anxiety  F33.1    F41.9     2. PTSD (post-traumatic stress disorder)  F43.10       Past Psychiatric History: The patient has had 2 previous psychiatric hospitalizations and as well as medication trials of Prozac Lexapro Strattera and Focalin XR   Past Medical History:  Past Medical History:  Diagnosis Date   ADHD (attention deficit hyperactivity disorder)    Asthma    Depression    Suicide attempt     Past Surgical History:  Procedure Laterality Date   TONSILLECTOMY      Family Psychiatric History: See below  Family History:  Family History  Problem Relation Age of Onset   Depression Mother    Bipolar disorder Father    Mental illness Father    Alcohol abuse Father    Schizophrenia Maternal Grandfather    Alcohol abuse Maternal Grandfather     Social History:  Social History   Socioeconomic History   Marital status: Single    Spouse name: Not on file   Number of children: Not on file   Years of education: Not on file   Highest education level: Not on file  Occupational History   Not on file  Tobacco Use   Smoking status: Every Day    Packs/day: .25    Types: Cigarettes   Smokeless tobacco: Never  Vaping Use   Vaping Use: Every day  Substance  and Sexual Activity   Alcohol use: Yes    Alcohol/week: 3.0 standard drinks of alcohol    Types: 3 Shots of liquor per week    Comment: occ   Drug use: Yes    Frequency: 3.0 times per week    Types: Marijuana   Sexual activity: Not Currently    Birth control/protection: None  Other Topics Concern   Not on file  Social History Narrative   Lives with mom, step-dad, brother. Step brothers every other weekend.   Social Determinants of Health   Financial Resource Strain: Not on file  Food Insecurity: Not on file  Transportation Needs: Not on file  Physical Activity: Not on file  Stress: Not on file  Social Connections: Not on file     Allergies: No Known Allergies  Metabolic Disorder Labs: Lab Results  Component Value Date   HGBA1C 5.2 10/02/2017   MPG 102.54 10/02/2017   MPG 108 11/14/2015   Lab Results  Component Value Date   PROLACTIN 57.7 (H) 10/02/2017   Lab Results  Component Value Date   CHOL 145 10/02/2017   TRIG 89 10/02/2017   HDL 39 (L) 10/02/2017   CHOLHDL 3.7 10/02/2017   VLDL 18 10/02/2017   LDLCALC 88 10/02/2017   LDLCALC 53 11/14/2015   Lab Results  Component Value Date   TSH 4.562 10/02/2017   TSH 3.926 11/14/2015    Therapeutic Level Labs: No results found for: "LITHIUM" No results found for: "VALPROATE" No results found for: "CBMZ"  Current Medications: Current Outpatient Medications  Medication Sig Dispense Refill   albuterol (VENTOLIN HFA) 108 (90 Base) MCG/ACT inhaler Inhale 1-2 puffs into the lungs every 6 (six) hours as needed for wheezing or shortness of breath. 18 g 0   albuterol (VENTOLIN HFA) 108 (90 Base) MCG/ACT inhaler Inhale 1-2 puffs into the lungs every 6 (six) hours as needed for wheezing or shortness of breath. 1 each 0   ARIPiprazole (ABILIFY) 5 MG tablet Take 1 tablet (5 mg total) by mouth daily. 30 tablet 2   benzonatate (TESSALON) 100 MG capsule Take 1 capsule (100 mg total) by mouth every 8 (eight) hours as needed for cough. 21 capsule 0   busPIRone (BUSPAR) 5 MG tablet Take 1 tablet (5 mg total) by mouth 3 (three) times daily. 90 tablet 3   cetirizine (ZYRTEC ALLERGY) 10 MG tablet Take 1 tablet (10 mg total) by mouth daily. 30 tablet 0   clonazePAM (KLONOPIN) 1 MG tablet Take 1 tablet (1 mg total) by mouth daily as needed for anxiety. 30 tablet 2   loratadine (CLARITIN) 10 MG tablet Take 1 tablet (10 mg total) by mouth daily. 30 tablet 0   oseltamivir (TAMIFLU) 75 MG capsule Take 1 capsule (75 mg total) by mouth every 12 (twelve) hours. 10 capsule 0   PARoxetine (PAXIL) 20 MG tablet Take 1 tablet (20 mg total) by mouth daily. 30 tablet 2   No current  facility-administered medications for this visit.     Musculoskeletal: Strength & Muscle Tone: within normal limits Gait & Station: normal Patient leans: N/A  Psychiatric Specialty Exam: Review of Systems  All other systems reviewed and are negative.   There were no vitals taken for this visit.There is no height or weight on file to calculate BMI.  General Appearance: Casual and Fairly Groomed  Eye Contact:  Good  Speech:  Clear and Coherent  Volume:  Normal  Mood:  Euthymic  Affect:  Congruent  Thought Process:  Goal Directed  Orientation:  Full (Time, Place, and Person)  Thought Content: Rumination   Suicidal Thoughts:  No  Homicidal Thoughts:  No  Memory:  Immediate;   Good Recent;   Good Remote;   Fair  Judgement:  Good  Insight:  Good  Psychomotor Activity:  Normal  Concentration:  Concentration: Good and Attention Span: Good  Recall:  Good  Fund of Knowledge: Good  Language: Good  Akathisia:  No  Handed:  Right  AIMS (if indicated): not done  Assets:  Communication Skills Desire for Improvement Physical Health Resilience Social Support Talents/Skills  ADL's:  Intact  Cognition: WNL  Sleep:  Good   Screenings: AIMS    Flowsheet Row [facility] Admission (Dis[facility]) [facility]  AIMS Total Score 0 0      AUDIT    Flowsheet Row [facility]) [facility] Admission (Discharged[facility]  Alcohol Use Disorder Identification Test Final Score (AUDIT) 6 0      GAD-7    Flowsheet Row Counselor from 09/06/2022 in [facility] [place] Counselor from 06/05/2021 in [facility] [place]  Total GAD-7 Score 16 20      PHQ2-9    Flowsheet Row  Counselor from 09/06/2022 in [facility] [place] Video Visit from 08/27/2022 in [facility] at [place] Video Visit from 01/25/2022 in [facility] at [place] Video Visit from 11/06/2021 in [facility] at [place] Video Visit from 07/02/2021 in [facility] Total Score 3 1 3  0 0  PHQ-9 Total Score 17 -- 8 -- --      Flowsheet Row Counselor from 09/06/2022 in [facility] at [place] Video Visit from 08/27/2022 in [facility] at [place] ED from 06/07/2022 in [facility])  C-SSRS RISK CATEGORY No Risk No Risk No Risk        Assessment and Plan: This patient is a 22 year old female with a history of depression and anxiety.  She is doing better on her current regimen.  Will continue Abilify 5 mg daily as a mood stabilizer, paroxetine 20 mg at bedtime for depression and anxiety, BuSpar 5 mg 3 times daily for anxiety and clonazepam 1 mg daily only as needed for anxiety attacks.  She will return to see me in 3 months  Collaboration of Care: Collaboration of Care: Referral or follow-up with counselor/therapist AEB patient will continue therapy with Suzan Garibaldi in our office  Patient/Guardian was advised Release of Information must be obtained prior to any record release in order to collaborate their care with an outside provider. Patient/Guardian was advised if they have not already done so to contact the registration department to sign all necessary forms in order for [place] to release information regarding their care.   Consent: Patient/Guardian gives verbal consent for treatment and assignment of benefits for services provided during this visit. Patient/Guardian expressed understanding and agreed to proceed.    Chelsea Ruder,  MD 10/01/2022, 3:55 PM

## 2022-10-18 ENCOUNTER — Ambulatory Visit (INDEPENDENT_AMBULATORY_CARE_PROVIDER_SITE_OTHER): Payer: Medicaid Other | Admitting: Clinical

## 2022-10-18 DIAGNOSIS — S29012A Strain of muscle and tendon of back wall of thorax, initial encounter: Secondary | ICD-10-CM | POA: Diagnosis not present

## 2022-10-18 DIAGNOSIS — S39012A Strain of muscle, fascia and tendon of lower back, initial encounter: Secondary | ICD-10-CM | POA: Diagnosis not present

## 2022-10-18 DIAGNOSIS — F419 Anxiety disorder, unspecified: Secondary | ICD-10-CM | POA: Diagnosis not present

## 2022-10-18 DIAGNOSIS — F4310 Post-traumatic stress disorder, unspecified: Secondary | ICD-10-CM | POA: Diagnosis not present

## 2022-10-18 DIAGNOSIS — F331 Major depressive disorder, recurrent, moderate: Secondary | ICD-10-CM

## 2022-10-18 DIAGNOSIS — F1210 Cannabis abuse, uncomplicated: Secondary | ICD-10-CM | POA: Diagnosis not present

## 2022-10-18 NOTE — Progress Notes (Signed)
In Person  I connected with Chelsea Wade on 10/18/22 at  8:00 AM EDT by a video enabled telemedicine application and verified that I am speaking with the correct person using two identifiers.  Location: Patient: Home Provider: Office   I discussed the limitations of evaluation and management by telemedicine and the availability of in person appointments. The patient expressed understanding and agreed to proceed.  THERAPIST PROGRESS NOTE   Session Time: 8:00 AM-8:55 AM   Participation Level: Active   Behavioral Response: CasualAlertDepressed   Type of Therapy: Individual Therapy   Treatment Goals addressed: Mood and Coping   Interventions: CBT, Motivational Interviewing, Solution Focused and Strength-based   Summary: Chelsea Wade is a 22 y.o. female who presents with Depression. The OPT therapist worked with the patient for her ongoing OPT treatment. The OPT therapist utilized Motivational Interviewing to assist in creating therapeutic repore. The patient in the session was engaged and work in collaboration giving feedback about her triggers and symptoms over the past few weeks. The patient spoke about being on her way to the hospital post hurting her back after moving furniture around in her living room. The patient spoke about plans for her upcoming 22st  birthday. The OPT therapist worked with the patient providing support and psycho-education and worked with the patient on grief management. The OPT therapist worked in the session with the patient on challenging negative thoughts and implementing positive thinking. The patient spoke about recently getting a new puppy and this keeping her busy. The patient has a upcoming job interview for weekend work. 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 The OPT therapist over-viewed recent med changes adding Abilify the patient had in her recent med therapy program with Dr. Tenny Craw.    Suicidal/Homicidal: Nowithout intent/plan    Therapist Response: The OPT therapist worked with the patient for the patients scheduled session. The patient was engaged in her session and gave feedback in relation to triggers, symptoms, and behavior responses over the past few weeks. The OPT therapist worked with the patient utilizing an in session Cognitive Behavioral Therapy exercise. The patient was responsive in the session and verbalized, "I have exams coming up in a few weeks at school, but my grades are pretty good ".The patient identified having more irritability and stronger emotional reaction as of recent.The OPT therapist worked with the patient on utilizing her support system as she continues to move forward through her grieving process in the recent loss of her Mother.The OPT therapist worked with the patient on her basic needs areas including sleep, eating habits, exercise, and hygiene.The OPT therapist will continue treatment work with the patient in her next scheduled session.   Plan: Return again in 2/3 weeks.   Diagnosis:      Axis I: Recurrent moderate,major depression with Anxiety, PTSD, Cannabis Use Disorder                           Axis II: No diagnosis   Collaboration of Care: No additional collaboration for this session.   Patient/Guardian was advised Release of Information must be obtained prior to any record release in order to collaborate their care with an outside provider. Patient/Guardian was advised if they have not already done so to contact the registration department to sign all necessary forms in order for [place] to release information regarding their care.    Consent: Patient/Guardian gives verbal consent for treatment and assignment of benefits for services provided  during this visit. Patient/Guardian expressed understanding and agreed to proceed.      I discussed the assessment and treatment plan with the patient. The patient was provided an opportunity to ask questions and all were answered. The patient agreed  with the plan and demonstrated an understanding of the instructions.   The patient was advised to call back or seek an in-person evaluation if the symptoms worsen or if the condition fails to improve as anticipated.   I provided 55 minutes of non-face-to-face time during this encounter.   Suzan Garibaldi, LCSW   10/18/2022

## 2022-10-23 DIAGNOSIS — Z419 Encounter for procedure for purposes other than remedying health state, unspecified: Secondary | ICD-10-CM | POA: Diagnosis not present

## 2022-10-28 DIAGNOSIS — R42 Dizziness and giddiness: Secondary | ICD-10-CM | POA: Diagnosis not present

## 2022-10-28 DIAGNOSIS — R000 Tachycardia, unspecified: Secondary | ICD-10-CM | POA: Diagnosis not present

## 2022-10-28 DIAGNOSIS — Z131 Encounter for screening for diabetes mellitus: Secondary | ICD-10-CM | POA: Diagnosis not present

## 2022-10-28 DIAGNOSIS — E559 Vitamin D deficiency, unspecified: Secondary | ICD-10-CM | POA: Diagnosis not present

## 2022-10-28 DIAGNOSIS — H6123 Impacted cerumen, bilateral: Secondary | ICD-10-CM | POA: Diagnosis not present

## 2022-11-12 ENCOUNTER — Ambulatory Visit (INDEPENDENT_AMBULATORY_CARE_PROVIDER_SITE_OTHER): Payer: Medicaid Other | Admitting: Clinical

## 2022-11-12 DIAGNOSIS — F4310 Post-traumatic stress disorder, unspecified: Secondary | ICD-10-CM

## 2022-11-12 DIAGNOSIS — F1210 Cannabis abuse, uncomplicated: Secondary | ICD-10-CM | POA: Diagnosis not present

## 2022-11-12 DIAGNOSIS — F331 Major depressive disorder, recurrent, moderate: Secondary | ICD-10-CM | POA: Diagnosis not present

## 2022-11-12 DIAGNOSIS — F419 Anxiety disorder, unspecified: Secondary | ICD-10-CM | POA: Diagnosis not present

## 2022-11-12 NOTE — Progress Notes (Signed)
Virtual Visit via Video Note  I connected with Chelsea Wade on 11/12/22 at  9:00 AM EDT by a video enabled telemedicine application and verified that I am speaking with the correct person using two identifiers.  Location: Patient: Home Provider: Office   I discussed the limitations of evaluation and management by telemedicine and the availability of in person appointments. The patient expressed understanding and agreed to proceed.  THERAPIST PROGRESS NOTE   Session Time: 9:00 AM-9:55 AM   Participation Level: Active   Behavioral Response: CasualAlertDepressed   Type of Therapy: Individual Therapy   Treatment Goals addressed: Mood and Coping   Interventions: CBT, Motivational Interviewing, Solution Focused and Strength-based   Summary: Chelsea Wade is a 22 y.o. female who presents with Depression. The OPT therapist worked with the patient for her ongoing OPT treatment. The OPT therapist utilized Motivational Interviewing to assist in creating therapeutic repore. The patient in the session was engaged and work in collaboration giving feedback about her triggers and symptoms over the past few weeks. The patient spoke about being referred by her PCP for Cardiology due to elevated heart rates. The patient spoke about currently working to use her pill organizer and is starting to take her medication with consistency. The patient spoke about plans for her upcoming 22st  birthday. The OPT therapist worked with the patient providing support and psycho-education and worked with the patient on grief management. The OPT therapist worked in the session with the patient on challenging negative thoughts and implementing positive thinking. The patient spoke about focus on being healthier including eating healthier and her realization of emotional eating and its negative impact on her physical and mental health. The patient spoke about changing her stress recently with taking finals and her realization  of overstressing as she did well on her test. The OPT therapist worked with the patient on her inner mantra , self confidence, self esteem, and healthier patterns for long lasting health improvements.    Suicidal/Homicidal: Nowithout intent/plan   Therapist Response: The OPT therapist worked with the patient for the patients scheduled session. The patient was engaged in her session and gave feedback in relation to triggers, symptoms, and behavior responses over the past few weeks. The OPT therapist worked with the patient utilizing an in session Cognitive Behavioral Therapy exercise. The patient was responsive in the session and verbalized, "I realize its is time for me to make changes and stop looking backward and start looking forward and work on my confidence ".The patient identified having lack of external support now and historically has been a large factor in her difficulty with self esteem, self confidence, and emotion regulation..The OPT therapist worked with the patient on utilizing her support system as she continues to move forward through her grieving process in the recent loss of her Mother.The OPT therapist worked with the patient on her basic needs areas including sleep, eating habits, exercise, and hygiene.The OPT therapist will continue treatment work with the patient in her next scheduled session.   Plan: Return again in 2/3 weeks.   Diagnosis:      Axis I: Recurrent moderate,major depression with Anxiety, PTSD, Cannabis Use Disorder                           Axis II: No diagnosis   Collaboration of Care: Overview of patient involvement with Dr. Tenny Craw in the med therapy program    Patient/Guardian was advised Release of Information must be  obtained prior to any record release in order to collaborate their care with an outside provider. Patient/Guardian was advised if they have not already done so to contact the registration department to sign all necessary forms in order for [place] to  release information regarding their care.    Consent: Patient/Guardian gives verbal consent for treatment and assignment of benefits for services provided during this visit. Patient/Guardian expressed understanding and agreed to proceed.      I discussed the assessment and treatment plan with the patient. The patient was provided an opportunity to ask questions and all were answered. The patient agreed with the plan and demonstrated an understanding of the instructions.   The patient was advised to call back or seek an in-person evaluation if the symptoms worsen or if the condition fails to improve as anticipated.   I provided 55 minutes of non-face-to-face time during this encounter.   Suzan Garibaldi, LCSW   11/12/2022

## 2022-11-22 ENCOUNTER — Other Ambulatory Visit: Payer: Self-pay

## 2022-11-22 DIAGNOSIS — F909 Attention-deficit hyperactivity disorder, unspecified type: Secondary | ICD-10-CM | POA: Insufficient documentation

## 2022-11-22 DIAGNOSIS — J45909 Unspecified asthma, uncomplicated: Secondary | ICD-10-CM | POA: Insufficient documentation

## 2022-11-22 DIAGNOSIS — Z0110 Encounter for examination of ears and hearing without abnormal findings: Secondary | ICD-10-CM | POA: Diagnosis not present

## 2022-11-22 DIAGNOSIS — F419 Anxiety disorder, unspecified: Secondary | ICD-10-CM | POA: Diagnosis not present

## 2022-11-22 DIAGNOSIS — Z0100 Encounter for examination of eyes and vision without abnormal findings: Secondary | ICD-10-CM | POA: Diagnosis not present

## 2022-11-22 DIAGNOSIS — R000 Tachycardia, unspecified: Secondary | ICD-10-CM | POA: Diagnosis not present

## 2022-11-22 DIAGNOSIS — Z0001 Encounter for general adult medical examination with abnormal findings: Secondary | ICD-10-CM | POA: Diagnosis not present

## 2022-11-22 DIAGNOSIS — T1491XA Suicide attempt, initial encounter: Secondary | ICD-10-CM | POA: Insufficient documentation

## 2022-11-22 DIAGNOSIS — F32A Depression, unspecified: Secondary | ICD-10-CM | POA: Diagnosis not present

## 2022-11-22 DIAGNOSIS — Z6841 Body Mass Index (BMI) 40.0 and over, adult: Secondary | ICD-10-CM | POA: Insufficient documentation

## 2022-11-22 DIAGNOSIS — J309 Allergic rhinitis, unspecified: Secondary | ICD-10-CM | POA: Insufficient documentation

## 2022-11-23 DIAGNOSIS — Z419 Encounter for procedure for purposes other than remedying health state, unspecified: Secondary | ICD-10-CM | POA: Diagnosis not present

## 2022-11-25 ENCOUNTER — Telehealth (INDEPENDENT_AMBULATORY_CARE_PROVIDER_SITE_OTHER): Payer: Medicaid Other | Admitting: Psychiatry

## 2022-11-25 ENCOUNTER — Encounter (HOSPITAL_COMMUNITY): Payer: Self-pay | Admitting: Psychiatry

## 2022-11-25 DIAGNOSIS — F411 Generalized anxiety disorder: Secondary | ICD-10-CM

## 2022-11-25 DIAGNOSIS — F902 Attention-deficit hyperactivity disorder, combined type: Secondary | ICD-10-CM

## 2022-11-25 DIAGNOSIS — F419 Anxiety disorder, unspecified: Secondary | ICD-10-CM

## 2022-11-25 DIAGNOSIS — F331 Major depressive disorder, recurrent, moderate: Secondary | ICD-10-CM

## 2022-11-25 MED ORDER — CLONAZEPAM 1 MG PO TABS: 1.0000 mg | ORAL_TABLET | Freq: Every day | ORAL | 2 refills | Status: DC | PRN

## 2022-11-25 MED ORDER — BUSPIRONE HCL 10 MG PO TABS: 10.0000 mg | ORAL_TABLET | Freq: Three times a day (TID) | ORAL | 2 refills | Status: DC

## 2022-11-25 MED ORDER — METHYLPHENIDATE HCL ER (OSM) 36 MG PO TBCR: 36.0000 mg | EXTENDED_RELEASE_TABLET | Freq: Every day | ORAL | 0 refills | Status: DC

## 2022-11-25 MED ORDER — ARIPIPRAZOLE 5 MG PO TABS: 5.0000 mg | ORAL_TABLET | Freq: Every day | ORAL | 2 refills | Status: DC

## 2022-11-25 MED ORDER — PAROXETINE HCL 20 MG PO TABS: 20.0000 mg | ORAL_TABLET | Freq: Every day | ORAL | 2 refills | Status: DC

## 2022-11-25 NOTE — Progress Notes (Signed)
Virtual Visit via Video Note  I connected with Chelsea Wade on 11/25/22 at 10:40 AM EDT by a video enabled telemedicine application and verified that I am speaking with the correct person using two identifiers.  Location: Patient: home Provider: office   I discussed the limitations of evaluation and management by telemedicine and the availability of in person appointments. The patient expressed understanding and agreed to proceed.      I discussed the assessment and treatment plan with the patient. The patient was provided an opportunity to ask questions and all were answered. The patient agreed with the plan and demonstrated an understanding of the instructions.   The patient was advised to call back or seek an in-person evaluation if the symptoms worsen or if the condition fails to improve as anticipated.  I provided 20 minutes of non-face-to-face time during this encounter.   Diannia Ruder, MD  [facility] MD/PA/NP OP Progress Note  11/25/2022 10:55 AM Chelsea Wade  MRN:  [id number]  Chief Complaint:  Chief Complaint  Patient presents with   Anxiety   Depression   Follow-up   HPI:  This patient is a 22 year old white female who lives with her boyfriend now in [place]. She is not working right now but attending classes at [employer]. She plans to go into cosmetology.   The patient returns for follow-up after 2 months.  She states that she recently saw family doctor who was very concerned about the amount of psychiatric medications she is taking.  She also was found to have tachycardia which was verified with a Holter monitor.  She is now on Toprol-XL and it seems to have calmed down quite a bit.  She thinks that the tachycardia was from her anxiety.  The patient states that her main problem is the anxiety right now.  She feels anxious "about everything."  Certain things trigger her like fear that her grandmother might die test at school or an argument with another  person.  She states that she feels anxious all the time and does not think her medications are helping.  She denies being depressed and does think the Abilify has helped with the depression anger and irritability.  She also states that she cannot focus for school and would like to go back on medication for this.  So that we do not do too many things in 1 day I suggested she try Concerta for the ADHD and that we increase the BuSpar to help with anxiety.  She has clonazepam to take for anxiety but she does not like to take it although I explained that this was probably the best thing to take for acute anxiety.  I also urged her to work on breathing techniques and mindfulness in her therapy.  She states that she is sleeping well and walks frequently for exercise. Visit Diagnosis:    ICD-10-CM   1. Generalized anxiety disorder  F41.1     2. Recurrent moderate major depressive disorder with anxiety (HCC)  F33.1    F41.9     3. Attention deficit hyperactivity disorder (ADHD), combined type  F90.2       Past Psychiatric History: The patient has had 2 previous psychiatric hospitalizations and as well as medication trials of Prozac Lexapro Strattera and Focalin XR   Past Medical History:  Past Medical History:  Diagnosis Date   ADHD (attention deficit hyperactivity disorder)    Allergic rhinitis    Anxiety disorder    Asthma    BMI  45.0-49.9, adult (HCC)    Convulsions/seizures (HCC) 11/10/2015   Depression    Drug overdose 11/10/2015   Intentional overdose of drug in tablet form (HCC) 11/10/2015   MDD (major depressive disorder), recurrent episode, severe (HCC) 11/12/2015   MDD (major depressive disorder), severe (HCC) 09/30/2017   Suicide attempt (HCC)    Tachycardia     Past Surgical History:  Procedure Laterality Date   TONSILLECTOMY      Family Psychiatric History: See below  Family History:  Family History  Problem Relation Age of Onset   Depression Mother    Bipolar disorder  Father    Mental illness Father    Alcohol abuse Father    Schizophrenia Maternal Grandfather    Alcohol abuse Maternal Grandfather     Social History:  Social History   Socioeconomic History   Marital status: Single    Spouse name: Not on file   Number of children: Not on file   Years of education: Not on file   Highest education level: Not on file  Occupational History   Not on file  Tobacco Use   Smoking status: Every Day    Packs/day: .25    Types: Cigarettes   Smokeless tobacco: Never  Vaping Use   Vaping Use: Every day  Substance and Sexual Activity   Alcohol use: Yes    Alcohol/week: 3.0 standard drinks of alcohol    Types: 3 Shots of liquor per week    Comment: occ   Drug use: Yes    Frequency: 3.0 times per week    Types: Marijuana   Sexual activity: Not Currently    Birth control/protection: None  Other Topics Concern   Not on file  Social History Narrative   Lives with mom, step-dad, brother. Step brothers every other weekend.   Social Determinants of Health   Financial Resource Strain: Not on file  Food Insecurity: Not on file  Transportation Needs: Not on file  Physical Activity: Not on file  Stress: Not on file  Social Connections: Not on file    Allergies: No Known Allergies  Metabolic Disorder Labs: Lab Results  Component Value Date   HGBA1C 5.2 10/02/2017   MPG 102.54 10/02/2017   MPG 108 11/14/2015   Lab Results  Component Value Date   PROLACTIN 57.7 (H) 10/02/2017   Lab Results  Component Value Date   CHOL 145 10/02/2017   TRIG 89 10/02/2017   HDL 39 (L) 10/02/2017   CHOLHDL 3.7 10/02/2017   VLDL 18 10/02/2017   LDLCALC 88 10/02/2017   LDLCALC 53 11/14/2015   Lab Results  Component Value Date   TSH 4.562 10/02/2017   TSH 3.926 11/14/2015    Therapeutic Level Labs: No results found for: "LITHIUM" No results found for: "VALPROATE" No results found for: "CBMZ"  Current Medications: Current Outpatient Medications   Medication Sig Dispense Refill   busPIRone (BUSPAR) 10 MG tablet Take 1 tablet (10 mg total) by mouth 3 (three) times daily. 90 tablet 2   methylphenidate (CONCERTA) 36 MG PO CR tablet Take 1 tablet (36 mg total) by mouth daily. 30 tablet 0   albuterol (VENTOLIN HFA) 108 (90 Base) MCG/ACT inhaler Inhale 1-2 puffs into the lungs every 6 (six) hours as needed for wheezing or shortness of breath. 1 each 0   ARIPiprazole (ABILIFY) 5 MG tablet Take 1 tablet (5 mg total) by mouth daily. 30 tablet 2   benzonatate (TESSALON) 100 MG capsule Take 1 capsule (100 mg total)  by mouth every 8 (eight) hours as needed for cough. 21 capsule 0   clonazePAM (KLONOPIN) 1 MG tablet Take 1 tablet (1 mg total) by mouth daily as needed for anxiety. 30 tablet 2   cyclobenzaprine (FLEXERIL) 10 MG tablet Take 5-10 mg by mouth 3 (three) times daily as needed for muscle spasms.     INCASSIA 0.35 MG tablet Take 1 tablet by mouth daily.     metoprolol succinate (TOPROL-XL) 25 MG 24 hr tablet Take 12.5 mg by mouth daily.     PARoxetine (PAXIL) 20 MG tablet Take 1 tablet (20 mg total) by mouth daily. 30 tablet 2   Vitamin D, Ergocalciferol, 50000 units CAPS Take 1 capsule by mouth once a week.     No current facility-administered medications for this visit.     Musculoskeletal: Strength & Muscle Tone: within normal limits Gait & Station: normal Patient leans: N/A  Psychiatric Specialty Exam: Review of Systems  Psychiatric/Behavioral:  Positive for decreased concentration. The patient is nervous/anxious.   All other systems reviewed and are negative.   There were no vitals taken for this visit.There is no height or weight on file to calculate BMI.  General Appearance: Casual and Fairly Groomed  Eye Contact:  Good  Speech:  Clear and Coherent  Volume:  Normal  Mood:  Anxious  Affect:  Appropriate and Congruent  Thought Process:  Goal Directed  Orientation:  Full (Time, Place, and Person)  Thought Content:  Rumination   Suicidal Thoughts:  No  Homicidal Thoughts:  No  Memory:  Immediate;   Good Recent;   Good Remote;   NA  Judgement:  Good  Insight:  Fair  Psychomotor Activity:  Normal  Concentration:  Concentration: Poor and Attention Span: Poor  Recall:  Good  Fund of Knowledge: Good  Language: Good  Akathisia:  No  Handed:  Right  AIMS (if indicated): not done  Assets:  Communication Skills Desire for Improvement Physical Health Resilience Social Support Talents/Skills  ADL's:  Intact  Cognition: WNL  Sleep:  Good   Screenings: AIMS    Flowsheet Row [facility] Ad[facility]) [facility]  AIMS Total Score 0 0      AUDIT    Flowsheet Row [facility]) [facility] Ad[facility] ([facility]  Alcohol Use Disorder Identification Test Final Score (AUDIT) 6 0      GAD-7    Flowsheet Row Counselor from 09/06/2022 in [facility] [place] Counselor from 06/05/2021 in [facility] at [place]  Total GAD-7 Score 16 20      PHQ2-9    Flowsheet Row Counselor from 09/06/2022 in [facility] at [place] Video Visit from 08/27/2022 in [facility] at [place] Video Visit from 01/25/2022 in [facility] at [place] Video Visit from 11/06/2021 in [facility] at [place] Video Visit from 07/02/2021 in [facility] at [facility] Total Score 3 1 3  0 0  PHQ-9 Total Score 17 -- 8 -- --      Flowsheet Row Counselor from 09/06/2022 in [facility] at [place] Video Visit from 08/27/2022 in [facility] Outpatient [facility] at [place] ED from 06/07/2022 in [facility]  at [facility])  C-SSRS RISK CATEGORY No Risk No Risk No Risk        Assessment and Plan: This patient is a 22 year old female with a history of depression and anxiety.  She is much more anxious by her report.  Given that we will increase BuSpar to 10 mg 3 times daily for anxiety.  She also has clonazepam 1 mg daily as needed for anxiety attacks.  For now she will continue paroxetine 20 mg at bedtime for depression and anxiety and Abilify 5 mg daily as a mood stabilizer.  Since her ADD symptoms have gotten worse with school we will it start Concerta 36 mg every morning.  I warned her that stimulants sometimes can make anxiety and tachycardia worse.  She voices understanding.  She will return to see me in 4 weeks  Collaboration of Care: Collaboration of Care: Referral or follow-up with counselor/therapist AEB patient will continue treatment with Suzan Garibaldi in our office for therapy  Patient/Guardian was advised Release of Information must be obtained prior to any record release in order to collaborate their care with an outside provider. Patient/Guardian was advised if they have not already done so to contact the registration department to sign all necessary forms in order for [place] to release information regarding their care.   Consent: Patient/Guardian gives verbal consent for treatment and assignment of benefits for services provided during this visit. Patient/Guardian expressed understanding and agreed to proceed.    Diannia Ruder, MD 11/25/2022, 10:55 AM

## 2022-12-16 ENCOUNTER — Ambulatory Visit (INDEPENDENT_AMBULATORY_CARE_PROVIDER_SITE_OTHER): Payer: Medicaid Other | Admitting: Clinical

## 2022-12-16 DIAGNOSIS — F331 Major depressive disorder, recurrent, moderate: Secondary | ICD-10-CM

## 2022-12-16 DIAGNOSIS — F1210 Cannabis abuse, uncomplicated: Secondary | ICD-10-CM | POA: Diagnosis not present

## 2022-12-16 DIAGNOSIS — F419 Anxiety disorder, unspecified: Secondary | ICD-10-CM | POA: Diagnosis not present

## 2022-12-16 DIAGNOSIS — F4310 Post-traumatic stress disorder, unspecified: Secondary | ICD-10-CM | POA: Diagnosis not present

## 2022-12-16 NOTE — Progress Notes (Signed)
Virtual Visit via Video Note   I connected with Chelsea Wade on 12/16/22 at  10:00 AM EDT by a video enabled telemedicine application and verified that I am speaking with the correct person using two identifiers.   Location: Patient: Home Provider: Office   I discussed the limitations of evaluation and management by telemedicine and the availability of in person appointments. The patient expressed understanding and agreed to proceed.   THERAPIST PROGRESS NOTE   Session Time: 10:00 AM-10:30 AM   Participation Level: Active   Behavioral Response: CasualAlertDepressed   Type of Therapy: Individual Therapy   Treatment Goals addressed: Mood and Coping   Interventions: CBT, Motivational Interviewing, Solution Focused and Strength-based   Summary: Chelsea Wade is a 22 y.o. female who presents with Depression. The OPT therapist worked with the patient for her ongoing OPT treatment. The OPT therapist utilized Motivational Interviewing to assist in creating therapeutic repore. The patient in the session was engaged and work in collaboration giving feedback about her triggers and symptoms over the past few weeks. The patient spoke about currently being on vacation at [facility].  The patient spoke about upcoming transitions with starting a job and finishing her current school semester.The OPT therapist worked with the patient providing support and psycho-education and worked with the patient on grief management. The OPT therapist worked in the session with the patient on challenging negative thoughts and implementing positive thinking. The patient spoke about focus on being healthier including eating healthier and her realization of emotional eating and its negative impact on her physical and mental health. The patient spoke about changing her medication at her last med therapy appointment and utilizing a pill organizer to assist with consistency in taking her prescribed medication. The OPT  therapist worked with the patient on her inner mantra , self confidence, self esteem, and healthier patterns for long lasting health improvements.    Suicidal/Homicidal: Nowithout intent/plan   Therapist Response: The OPT therapist worked with the patient for the patients scheduled session. The patient was engaged in her session and gave feedback in relation to triggers, symptoms, and behavior responses over the past few weeks. The OPT therapist worked with the patient utilizing an in session Cognitive Behavioral Therapy exercise. The patient was responsive in the session and verbalized, "I have found the adult coloring books and being outside helpful in improving my mental health ".The patient identified upcoming stressors of starting a new job and balancing this with finishing her school semester.The OPT therapist worked with the patient on her basic needs areas including sleep, eating habits, exercise, and hygiene.The OPT therapist will continue treatment work with the patient in her next scheduled session.   Plan: Return again in 2/3 weeks.   Diagnosis:      Axis I: Recurrent moderate,major depression with Anxiety, PTSD, Cannabis Use Disorder                           Axis II: No diagnosis   Collaboration of Care: Overview of patient involvement with Dr. Tenny Craw in the med therapy program    Patient/Guardian was advised Release of Information must be obtained prior to any record release in order to collaborate their care with an outside provider. Patient/Guardian was advised if they have not already done so to contact the registration department to sign all necessary forms in order for [place] to release information regarding their care.    Consent: Patient/Guardian gives verbal consent for treatment and  assignment of benefits for services provided during this visit. Patient/Guardian expressed understanding and agreed to proceed.      I discussed the assessment and treatment plan with the patient. The  patient was provided an opportunity to ask questions and all were answered. The patient agreed with the plan and demonstrated an understanding of the instructions.   The patient was advised to call back or seek an in-person evaluation if the symptoms worsen or if the condition fails to improve as anticipated.   I provided 30 minutes of non-face-to-face time during this encounter.   Suzan Garibaldi, LCSW   12/16/2022

## 2022-12-20 ENCOUNTER — Other Ambulatory Visit: Payer: Medicaid Other

## 2022-12-23 DIAGNOSIS — Z419 Encounter for procedure for purposes other than remedying health state, unspecified: Secondary | ICD-10-CM | POA: Diagnosis not present

## 2022-12-31 ENCOUNTER — Ambulatory Visit (HOSPITAL_COMMUNITY): Payer: Medicaid Other | Admitting: Clinical

## 2023-01-03 ENCOUNTER — Ambulatory Visit: Payer: Medicaid Other | Admitting: Cardiology

## 2023-01-03 ENCOUNTER — Ambulatory Visit (LOCAL_COMMUNITY_HEALTH_CENTER): Payer: Self-pay

## 2023-01-03 DIAGNOSIS — Z111 Encounter for screening for respiratory tuberculosis: Secondary | ICD-10-CM

## 2023-01-06 ENCOUNTER — Ambulatory Visit (LOCAL_COMMUNITY_HEALTH_CENTER): Payer: Self-pay

## 2023-01-06 DIAGNOSIS — Z111 Encounter for screening for respiratory tuberculosis: Secondary | ICD-10-CM

## 2023-01-06 LAB — TB SKIN TEST
Induration: 0 mm
TB Skin Test: NEGATIVE

## 2023-01-10 ENCOUNTER — Ambulatory Visit (INDEPENDENT_AMBULATORY_CARE_PROVIDER_SITE_OTHER): Payer: Medicaid Other | Admitting: Clinical

## 2023-01-10 DIAGNOSIS — F4310 Post-traumatic stress disorder, unspecified: Secondary | ICD-10-CM

## 2023-01-10 DIAGNOSIS — F1290 Cannabis use, unspecified, uncomplicated: Secondary | ICD-10-CM | POA: Diagnosis not present

## 2023-01-10 DIAGNOSIS — F419 Anxiety disorder, unspecified: Secondary | ICD-10-CM | POA: Diagnosis not present

## 2023-01-10 DIAGNOSIS — F1210 Cannabis abuse, uncomplicated: Secondary | ICD-10-CM

## 2023-01-10 DIAGNOSIS — F331 Major depressive disorder, recurrent, moderate: Secondary | ICD-10-CM

## 2023-01-10 NOTE — Progress Notes (Signed)
Virtual Visit via Video Note   I connected with Chelsea Wade on 01/10/23 at  11:00 AM EDT by a video enabled telemedicine application and verified that I am speaking with the correct person using two identifiers.   Location: Patient: Home Provider: Office   I discussed the limitations of evaluation and management by telemedicine and the availability of in person appointments. The patient expressed understanding and agreed to proceed.   THERAPIST PROGRESS NOTE   Session Time: 11:00 AM-11:30 AM   Participation Level: Active   Behavioral Response: CasualAlertDepressed   Type of Therapy: Individual Therapy   Treatment Goals addressed: Mood and Coping   Interventions: CBT, Motivational Interviewing, Solution Focused and Strength-based   Summary: Chelsea Wade is a 22 y.o. female who presents with Depression. The OPT therapist worked with the patient for her ongoing OPT treatment. The OPT therapist utilized Motivational Interviewing to assist in creating therapeutic repore. The patient in the session was engaged and work in collaboration giving feedback about her triggers and symptoms over the past few weeks. 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 The patient noted she is currently studying the drivers book and then she will go to take her license test and has set a goal to get her license by August 8th. The OPT therapist worked with the patient on her inner mantra , self confidence, self esteem, and healthier patterns for long lasting health improvements. The patient spoke about the magnitude of  upcoming changes for her including trying to get her license, starting school, and providing transportation for her partner.   Suicidal/Homicidal: Nowithout intent/plan   Therapist Response: The OPT therapist worked with the patient for the patients scheduled session. The patient was engaged in her session and gave feedback in relation to triggers, symptoms, and behavior responses over the past few weeks. The OPT therapist worked with the patient utilizing an in session Cognitive Behavioral Therapy exercise. The patient was responsive in the session and verbalized, "I have stress around money because with my schedule I do not think I will also have time to work ".The patient identified upcoming stressors of trying to get her drivers license, starting cosmetology school, and balance school with trying to find and work a job.The OPT therapist worked with the patient on her basic needs areas including sleep, eating habits, exercise, and hygiene The OPT therapist worked with the patient on taking her upcoming transitions one step at time. The OPT therapist worked with the patient on management of stress/ anxiety with coping skills and using her support system.The OPT therapist will continue treatment work with the patient in her next scheduled session.   Plan: Return again in 2/3 weeks.   Diagnosis:      Axis I: Recurrent moderate,major depression with Anxiety, PTSD, Cannabis Use Disorder                           Axis II: No diagnosis   Collaboration of Care: Overview of patient involvement with Dr. Tenny Craw in the med therapy program  Patient/Guardian was advised Release of Information must be obtained prior to any record release in order to collaborate their care with an outside provider. Patient/Guardian was advised if they have not already done so to contact the registration department to sign all necessary forms in order for [place] to release information regarding their care.    Consent: Patient/Guardian  gives verbal consent for treatment and assignment of benefits for services provided during this visit. Patient/Guardian expressed understanding and agreed to proceed.      I discussed the assessment and treatment plan with the patient. The patient was provided an opportunity to ask questions and all were answered. The patient agreed with the plan and demonstrated an understanding of the instructions.   The patient was advised to call back or seek an in-person evaluation if the symptoms worsen or if the condition fails to improve as anticipated.   I provided 30 minutes of non-face-to-face time during this encounter.   Suzan Garibaldi, LCSW   01/10/2023

## 2023-01-11 ENCOUNTER — Emergency Department: Payer: Medicaid Other

## 2023-01-11 ENCOUNTER — Other Ambulatory Visit: Payer: Self-pay

## 2023-01-11 ENCOUNTER — Emergency Department
Admission: EM | Admit: 2023-01-11 | Discharge: 2023-01-11 | Disposition: A | Discharge status: Home / Self Care | Payer: Medicaid Other | Attending: Emergency Medicine | Admitting: Emergency Medicine

## 2023-01-11 DIAGNOSIS — R1032 Left lower quadrant pain: Secondary | ICD-10-CM | POA: Diagnosis not present

## 2023-01-11 DIAGNOSIS — D72829 Elevated white blood cell count, unspecified: Secondary | ICD-10-CM | POA: Diagnosis not present

## 2023-01-11 DIAGNOSIS — R109 Unspecified abdominal pain: Secondary | ICD-10-CM

## 2023-01-11 LAB — BASIC METABOLIC PANEL
Anion gap: 10 (ref 5–15)
BUN: 13 mg/dL (ref 6–20)
CO2: 21 mmol/L — ABNORMAL LOW (ref 22–32)
Calcium: 9.2 mg/dL (ref 8.9–10.3)
Chloride: 105 mmol/L (ref 98–111)
Creatinine, Ser: 0.68 mg/dL (ref 0.44–1.00)
GFR, Estimated: >60 mL/min (ref >60)
Glucose, Bld: 102 mg/dL — ABNORMAL HIGH (ref 70–99)
Potassium: 3.6 mmol/L (ref 3.5–5.1)
Sodium: 136 mmol/L (ref 135–145)

## 2023-01-11 LAB — URINALYSIS, ROUTINE W REFLEX MICROSCOPIC
Bacteria, UA: NONE SEEN
Bilirubin Urine: NEGATIVE
Glucose, UA: NEGATIVE mg/dL
Hgb urine dipstick: NEGATIVE
Ketones, ur: NEGATIVE mg/dL
Nitrite: NEGATIVE
Protein, ur: NEGATIVE mg/dL
Specific Gravity, Urine: 1.024 (ref 1.005–1.030)
pH: 5 (ref 5.0–8.0)

## 2023-01-11 LAB — CBC
HCT: 37.2 % (ref 36.0–46.0)
Hemoglobin: 12.5 g/dL (ref 12.0–15.0)
MCH: 30.2 pg (ref 26.0–34.0)
MCHC: 33.6 g/dL (ref 30.0–36.0)
MCV: 89.9 fL (ref 80.0–100.0)
Platelets: 287 10*3/uL (ref 150–400)
RBC: 4.14 MIL/uL (ref 3.87–5.11)
RDW: 12.5 % (ref 11.5–15.5)
WBC: 13.5 10*3/uL — ABNORMAL HIGH (ref 4.0–10.5)
nRBC: 0 % (ref 0.0–0.2)

## 2023-01-11 LAB — POC URINE PREG, ED: Preg Test, Ur: NEGATIVE

## 2023-01-11 MED ORDER — MELOXICAM 15 MG PO TABS: 15.0000 mg | ORAL_TABLET | Freq: Every day | ORAL | 0 refills | Status: AC

## 2023-01-11 MED ORDER — HYDROCODONE-ACETAMINOPHEN 5-325 MG PO TABS
1.0000 | ORAL_TABLET | Freq: Once | ORAL | Status: AC
  Administered 2023-01-11: 1 via ORAL
  Filled 2023-01-11: qty 1

## 2023-01-11 NOTE — Discharge Instructions (Signed)
Follow-up with your regular doctor if not improving to 3 days.  Return emergency department if worsening.  Take the meloxicam daily.  Once you are feeling better you can stop this medication.

## 2023-01-11 NOTE — ED Triage Notes (Signed)
Pt to ed from home for Left sided flank pain x 6 hours. Pt is caox4, in no acute distress and ambulatory in triage. Pt denies any burning during urination.

## 2023-01-11 NOTE — ED Provider Notes (Signed)
[facility] Provider Note    Event Date/Time   First MD Initiated Contact with Patient 01/11/23 2012     (approximate)   History   Flank Pain (Left x 6 hours)   HPI  Chelsea Wade is a 22 y.o. female with history of seizures, depression, mental health issues presents emergency department with left lower quadrant pain.  Patient drank a whole bottle of margarita mix last night.  Does not remember hurting herself.  States she was sitting in the recliner which started having left hip pain.  No fever or chills.  Some left lower quadrant abdominal pain.  No diarrhea.  No history of ovarian cyst      Physical Exam   Triage Vital Signs: ED Triage Vitals  Encounter Vitals Group     BP 01/11/23 1911 (!) 134/104     Systolic BP Percentile --      Diastolic BP Percentile --      Pulse Rate 01/11/23 1911 (!) 120     Resp 01/11/23 1911 16     Temp 01/11/23 1911 98.4 F (36.9 C)     Temp Source 01/11/23 1911 Oral     SpO2 01/11/23 1911 98 %     Weight --      Height 01/11/23 1912 5\' 3"  (1.6 m)     Head Circumference --      Peak Flow --      Pain Score 01/11/23 1912 7     Pain Loc --      Pain Education --      Exclude from Growth Chart --     Most recent vital signs: Vitals:   01/11/23 1911  BP: (!) 134/104  Pulse: (!) 120  Resp: 16  Temp: 98.4 F (36.9 C)  SpO2: 98%     General: Awake, no distress.   CV:  Good peripheral perfusion. regular rate and  rhythm Resp:  Normal effort. Lungs cta Abd:  No distention.  Mildly tender left lower quadrant, some tenderness into the inguinal area, bowel sounds normal Other:      ED Results / Procedures / Treatments   Labs (all labs ordered are listed, but only abnormal results are displayed) Labs Reviewed  URINALYSIS, ROUTINE W REFLEX MICROSCOPIC - Abnormal; Notable for the following components:      Result Value   Color, Urine YELLOW (*)    APPearance HAZY (*)    Leukocytes,Ua TRACE (*)    All  other components within normal limits  BASIC METABOLIC PANEL - Abnormal; Notable for the following components:   CO2 21 (*)    Glucose, Bld 102 (*)    All other components within normal limits  CBC - Abnormal; Notable for the following components:   WBC 13.5 (*)    All other components within normal limits  POC URINE PREG, ED     EKG     RADIOLOGY CT renal stone    PROCEDURES:   Procedures   MEDICATIONS ORDERED IN ED: Medications  HYDROcodone-acetaminophen (NORCO/VICODIN) 5-325 MG per tablet 1 tablet (1 tablet Oral Given 01/11/23 2025)     IMPRESSION / MDM / ASSESSMENT AND PLAN / ED COURSE  I reviewed the triage vital signs and the nursing notes.                              Differential diagnosis includes, but is not limited to, kidney stone, ovarian cyst,  diverticulitis, muscle strain  Patient's presentation is most consistent with acute illness / injury with system symptoms.   Patient's labs are reassuring, WBC is slightly elevated at 13.5  CT renal stone study independently reviewed and interpreted by me as being negative for any acute abnormality  Patient was given hydrocodone while here in the ED for pain..   I did discuss a lab findings and imaging with patient.  She is to follow-up with her regular doctor if not improving in 2 to 3 days.  Return emergency department worsening.  Patient is in agreement treatment plan.  Discharged in stable condition.  He was given a prescription for meloxicam for pain.   FINAL CLINICAL IMPRESSION(S) / ED DIAGNOSES   Final diagnoses:  Left flank pain     Rx / DC Orders   ED Discharge Orders          Ordered    meloxicam (MOBIC) 15 MG tablet  Daily        01/11/23 2054             Note:  This document was prepared using Dragon voice recognition software and may include unintentional dictation errors.    Faythe Ghee, PA-C 01/11/23 2107    Phineas Semen, MD 01/11/23 2132

## 2023-01-13 DIAGNOSIS — F419 Anxiety disorder, unspecified: Secondary | ICD-10-CM | POA: Diagnosis not present

## 2023-01-13 DIAGNOSIS — Z13228 Encounter for screening for other metabolic disorders: Secondary | ICD-10-CM | POA: Diagnosis not present

## 2023-01-13 DIAGNOSIS — J45909 Unspecified asthma, uncomplicated: Secondary | ICD-10-CM | POA: Diagnosis not present

## 2023-01-13 DIAGNOSIS — J45991 Cough variant asthma: Secondary | ICD-10-CM | POA: Diagnosis not present

## 2023-01-23 DIAGNOSIS — Z419 Encounter for procedure for purposes other than remedying health state, unspecified: Secondary | ICD-10-CM | POA: Diagnosis not present

## 2023-01-30 ENCOUNTER — Ambulatory Visit
Admission: EM | Admit: 2023-01-30 | Discharge: 2023-01-30 | Disposition: A | Discharge status: Home / Self Care | Payer: Medicaid Other | Attending: Emergency Medicine | Admitting: Emergency Medicine

## 2023-01-30 DIAGNOSIS — U071 COVID-19: Secondary | ICD-10-CM | POA: Diagnosis not present

## 2023-01-30 DIAGNOSIS — J069 Acute upper respiratory infection, unspecified: Secondary | ICD-10-CM | POA: Diagnosis not present

## 2023-01-30 MED ORDER — IPRATROPIUM BROMIDE 0.03 % NA SOLN: 2.0000 | Freq: Two times a day (BID) | NASAL | 0 refills | Status: AC

## 2023-01-30 MED ORDER — KETOROLAC TROMETHAMINE 30 MG/ML IJ SOLN
30.0000 mg | Freq: Once | INTRAMUSCULAR | Status: AC
  Administered 2023-01-30: 30 mg via INTRAMUSCULAR

## 2023-01-30 MED ORDER — IPRATROPIUM BROMIDE 0.03 % NA SOLN: 2.0000 | Freq: Two times a day (BID) | NASAL | 12 refills | Status: DC

## 2023-01-30 NOTE — ED Triage Notes (Signed)
Patient presents to UC for nasal congestion and HA since yesterday. Denies fever. Treating symptoms with mucinex.

## 2023-01-30 NOTE — ED Provider Notes (Signed)
[name]    CSN: [id number] Arrival date & time: 01/30/23  1547      History   Chief Complaint Chief Complaint  Patient presents with   Nasal Congestion    HPI Chelsea Wade is a 22 y.o. female.   Patient presents for evaluation of subjective fevers, chills, body aches, nasal congestion, rhinorrhea, sore throat and a mild nonproductive cough present for 1 day.  Experiencing a intermittent frontal headache.  Sore throat exacerbated by postnasal drip.  No known sick contacts but recently started working at a daycare.  Tolerating food and liquids.  Has attempted use of Mucinex and Delsym which has been minimally helpful.  History of asthma, denying shortness of breath and wheezing.  Requesting COVID testing.  Past Medical History:  Diagnosis Date   ADHD (attention deficit hyperactivity disorder)    Allergic rhinitis    Anxiety disorder    Asthma    BMI 45.0-49.9, adult (HCC)    Convulsions/seizures (HCC) 11/10/2015   Depression    Drug overdose 11/10/2015   Intentional overdose of drug in tablet form (HCC) 11/10/2015   MDD (major depressive disorder), recurrent episode, severe (HCC) 11/12/2015   MDD (major depressive disorder), severe (HCC) 09/30/2017   Suicide attempt (HCC)    Tachycardia     Patient Active Problem List   Diagnosis Date Noted   ADHD (attention deficit hyperactivity disorder) 11/22/2022   Allergic rhinitis 11/22/2022   Anxiety disorder 11/22/2022   Asthma 11/22/2022   BMI 45.0-49.9, adult (HCC) 11/22/2022   Depression 11/22/2022   Suicide attempt (HCC) 11/22/2022   Tachycardia 11/22/2022   MDD (major depressive disorder), severe (HCC) 09/30/2017   MDD (major depressive disorder), recurrent episode, severe (HCC) 11/12/2015   Drug overdose 11/10/2015   Intentional overdose of drug in tablet form (HCC) 11/10/2015   Convulsions/seizures (HCC) 11/10/2015    Past Surgical History:  Procedure Laterality Date   TONSILLECTOMY      OB  History   No obstetric history on file.      Home Medications    Prior to Admission medications   Medication Sig Start Date End Date Taking? Authorizing Provider  albuterol (VENTOLIN HFA) 108 (90 Base) MCG/ACT inhaler Inhale 1-2 puffs into the lungs every 6 (six) hours as needed for wheezing or shortness of breath. 06/07/22   Gustavus Bryant, FNP  ARIPiprazole (ABILIFY) 5 MG tablet Take 1 tablet (5 mg total) by mouth daily. 11/25/22 11/25/23  Myrlene Broker, MD  benzonatate (TESSALON) 100 MG capsule Take 1 capsule (100 mg total) by mouth every 8 (eight) hours as needed for cough. 06/07/22   Gustavus Bryant, FNP  busPIRone (BUSPAR) 10 MG tablet Take 1 tablet (10 mg total) by mouth 3 (three) times daily. 11/25/22   Myrlene Broker, MD  clonazePAM (KLONOPIN) 1 MG tablet Take 1 tablet (1 mg total) by mouth daily as needed for anxiety. 11/25/22 11/25/23  Myrlene Broker, MD  cyclobenzaprine (FLEXERIL) 10 MG tablet Take 5-10 mg by mouth 3 (three) times daily as needed for muscle spasms. 10/18/22   [provider]  INCASSIA 0.35 MG tablet Take 1 tablet by mouth daily. 11/21/22   [provider]  ipratropium (ATROVENT) 0.03 % nasal spray Place 2 sprays into both nostrils every 12 (twelve) hours. 01/30/23   Valinda Hoar, NP  meloxicam (MOBIC) 15 MG tablet Take 1 tablet (15 mg total) by mouth daily for 30 doses. 01/11/23 02/10/23  Faythe Ghee, PA-C  methylphenidate (CONCERTA)  36 MG PO CR tablet Take 1 tablet (36 mg total) by mouth daily. 11/25/22 11/25/23  Myrlene Broker, MD  metoprolol succinate (TOPROL-XL) 25 MG 24 hr tablet Take 12.5 mg by mouth daily. 11/08/22   [provider]  PARoxetine (PAXIL) 20 MG tablet Take 1 tablet (20 mg total) by mouth daily. 11/25/22 11/25/23  Myrlene Broker, MD  Vitamin D, Ergocalciferol, 50000 units CAPS Take 1 capsule by mouth once a week. 10/30/22   [provider]    Family History Family History  Problem Relation Age of Onset   Depression  Mother    Bipolar disorder Father    Mental illness Father    Alcohol abuse Father    Schizophrenia Maternal Grandfather    Alcohol abuse Maternal Grandfather     Social History Social History   Tobacco Use   Smoking status: Every Day    Current packs/day: 0.25    Types: Cigarettes   Smokeless tobacco: Never  Vaping Use   Vaping status: Every Day  Substance Use Topics   Alcohol use: Yes    Alcohol/week: 3.0 standard drinks of alcohol    Types: 3 Shots of liquor per week    Comment: occ   Drug use: Yes    Frequency: 3.0 times per week    Types: Marijuana     Allergies   Patient has no known allergies.   Review of Systems Review of Systems Defer to HPI   Physical Exam Triage Vital Signs ED Triage Vitals  Encounter Vitals Group     BP 01/30/23 1556 123/77     Systolic BP Percentile --      Diastolic BP Percentile --      Pulse Rate 01/30/23 1556 (!) 108     Resp 01/30/23 1556 18     Temp 01/30/23 1556 99 F (37.2 C)     Temp Source 01/30/23 1556 Temporal     SpO2 01/30/23 1556 95 %     Weight --      Height --      Head Circumference --      Peak Flow --      Pain Score 01/30/23 1557 5     Pain Loc --      Pain Education --      Exclude from Growth Chart --    No data found.  Updated Vital Signs BP 123/77 (BP Location: Left Arm)   Pulse (!) 108   Temp 99 F (37.2 C) (Temporal)   Resp 18   LMP 01/23/2023 (Exact Date)   SpO2 95%   Visual Acuity Right Eye Distance:   Left Eye Distance:   Bilateral Distance:    Right Eye Near:   Left Eye Near:    Bilateral Near:     Physical Exam Constitutional:      Appearance: Normal appearance.  HENT:     Head: Normocephalic.     Right Ear: Tympanic membrane, ear canal and external ear normal.     Left Ear: Tympanic membrane, ear canal and external ear normal.     Nose: Congestion and rhinorrhea present.     Mouth/Throat:     Mouth: Mucous membranes are moist.     Pharynx: No posterior  oropharyngeal erythema.  Eyes:     Extraocular Movements: Extraocular movements intact.  Cardiovascular:     Rate and Rhythm: Normal rate and regular rhythm.     Pulses: Normal pulses.     Heart sounds: Normal  heart sounds.  Pulmonary:     Effort: Pulmonary effort is normal.     Breath sounds: Normal breath sounds.  Skin:    General: Skin is warm and dry.  Neurological:     Mental Status: She is alert and oriented to person, place, and time. Mental status is at baseline.      UC Treatments / Results  Labs (all labs ordered are listed, but only abnormal results are displayed) Labs Reviewed  SARS CORONAVIRUS 2 (TAT 6-24 HRS)    EKG   Radiology No results found.  Procedures Procedures (including critical care time)  Medications Ordered in UC Medications  ketorolac (TORADOL) 30 MG/ML injection 30 mg (30 mg Intramuscular Given 01/30/23 1618)    Initial Impression / Assessment and Plan / UC Course  I have reviewed the triage vital signs and the nursing notes.  Pertinent labs & imaging results that were available during my care of the patient were reviewed by me and considered in my medical decision making (see chart for details).  Viral URI  Patient is in no signs of distress nor toxic appearing.  Vital signs are stable.  Low suspicion for pneumonia, pneumothorax or bronchitis and therefore will defer imaging.COVID test is pending, reviewed quarantine guidelines per CDC recommendations due to history of asthma will qualify for antivirals if positive, per chart review typically during times of aspiratory illness experiencing asthmatic flare, currently has no shortness of breath or wheezing, lungs are clear to auscultation O2 saturation at 95% on room air, stable for outpatient management, endorses that she has albuterol inhaler available at home and does not need refill at this time.  prescribed Atrovent nasal spray as congestion is most worrisome symptom, Toradol injection given  in office for management of headache.May use additional over-the-counter medications as needed for supportive care.  May follow-up with urgent care as needed if symptoms persist or worsen.  Note given.   Final Clinical Impressions(s) / UC Diagnoses   Final diagnoses:  Viral URI     Discharge Instructions      Your symptoms today are most likely being caused by a virus and should steadily improve in time it can take up to 7 to 10 days before you truly start to see a turnaround however things will get better  COVID test is pending up to 24 hours, you will be notified of positive test results only, may see all results on MyChart, you will only need to quarantine if having fever, if no fever may continue activity wearing mask until symptoms have resolved, if positive you will receive antiviral medicine which helps to minimize the severity of symptoms but does not fully take away illness, medicine will be sent in at time of notification  To help with nasal congestion you may use nasal spray every morning and every evening to help clear out the sinuses  May continue over-the-counter medicines as well as attempt any of the following below  To Help with headache you have been given an injection of Toradol which helps to reduce inflammation here in the office ideally you will see some improvement in 30 minutes to an hour    You can take Tylenol and/or Ibuprofen as needed for fever reduction and pain relief.   For cough: honey 1/2 to 1 teaspoon (you can dilute the honey in water or another fluid).  You can also use guaifenesin and dextromethorphan for cough. You can use a humidifier for chest congestion and cough.  If you don't  have a humidifier, you can sit in the bathroom with the hot shower running.      For sore throat: try warm salt water gargles, cepacol lozenges, throat spray, warm tea or water with lemon/honey, popsicles or ice, or OTC cold relief medicine for throat discomfort.   For  congestion: take a daily anti-histamine like Zyrtec, Claritin, and a oral decongestant, such as pseudoephedrine.  You can also use Flonase 1-2 sprays in each nostril daily.   It is important to stay hydrated: drink plenty of fluids (water, gatorade/powerade/pedialyte, juices, or teas) to keep your throat moisturized and help further relieve irritation/discomfort.  At any point if you begin to have any difficulty breathing please return for reevaluation    ED Prescriptions     Medication Sig Dispense Auth. Provider   ipratropium (ATROVENT) 0.03 % nasal spray  (Status: Discontinued) Place 2 sprays into both nostrils every 12 (twelve) hours. 30 mL Rozina Pointer R, NP   ipratropium (ATROVENT) 0.03 % nasal spray Place 2 sprays into both nostrils every 12 (twelve) hours. 30 mL Valinda Hoar, NP      PDMP not reviewed this encounter.   Valinda Hoar, NP 01/30/23 1715

## 2023-01-30 NOTE — Discharge Instructions (Addendum)
Your symptoms today are most likely being caused by a virus and should steadily improve in time it can take up to 7 to 10 days before you truly start to see a turnaround however things will get better  COVID test is pending up to 24 hours, you will be notified of positive test results only, may see all results on MyChart, you will only need to quarantine if having fever, if no fever may continue activity wearing mask until symptoms have resolved, if positive you will receive antiviral medicine which helps to minimize the severity of symptoms but does not fully take away illness, medicine will be sent in at time of notification  To help with nasal congestion you may use nasal spray every morning and every evening to help clear out the sinuses  May continue over-the-counter medicines as well as attempt any of the following below  To Help with headache you have been given an injection of Toradol which helps to reduce inflammation here in the office ideally you will see some improvement in 30 minutes to an hour    You can take Tylenol and/or Ibuprofen as needed for fever reduction and pain relief.   For cough: honey 1/2 to 1 teaspoon (you can dilute the honey in water or another fluid).  You can also use guaifenesin and dextromethorphan for cough. You can use a humidifier for chest congestion and cough.  If you don't have a humidifier, you can sit in the bathroom with the hot shower running.      For sore throat: try warm salt water gargles, cepacol lozenges, throat spray, warm tea or water with lemon/honey, popsicles or ice, or OTC cold relief medicine for throat discomfort.   For congestion: take a daily anti-histamine like Zyrtec, Claritin, and a oral decongestant, such as pseudoephedrine.  You can also use Flonase 1-2 sprays in each nostril daily.   It is important to stay hydrated: drink plenty of fluids (water, gatorade/powerade/pedialyte, juices, or teas) to keep your throat moisturized and help  further relieve irritation/discomfort.  At any point if you begin to have any difficulty breathing please return for reevaluation

## 2023-02-07 ENCOUNTER — Ambulatory Visit: Payer: Medicaid Other | Admitting: Cardiology

## 2023-02-12 ENCOUNTER — Telehealth (HOSPITAL_COMMUNITY): Payer: Self-pay | Admitting: Clinical

## 2023-02-12 NOTE — Telephone Encounter (Signed)
Called and confirmed appointment with Suzan Garibaldi on 08/23.

## 2023-02-14 ENCOUNTER — Ambulatory Visit: Payer: Medicaid Other | Attending: Cardiology

## 2023-02-14 ENCOUNTER — Ambulatory Visit (HOSPITAL_COMMUNITY): Payer: Medicaid Other | Admitting: Clinical

## 2023-02-23 DIAGNOSIS — Z419 Encounter for procedure for purposes other than remedying health state, unspecified: Secondary | ICD-10-CM | POA: Diagnosis not present

## 2023-02-28 ENCOUNTER — Ambulatory Visit (HOSPITAL_COMMUNITY): Payer: Medicaid Other | Admitting: Clinical

## 2023-03-25 DIAGNOSIS — Z419 Encounter for procedure for purposes other than remedying health state, unspecified: Secondary | ICD-10-CM | POA: Diagnosis not present

## 2023-04-25 DIAGNOSIS — Z419 Encounter for procedure for purposes other than remedying health state, unspecified: Secondary | ICD-10-CM | POA: Diagnosis not present

## 2023-05-01 ENCOUNTER — Ambulatory Visit
Admission: EM | Admit: 2023-05-01 | Discharge: 2023-05-01 | Discharge status: Against Medical Advice | Payer: Medicaid Other

## 2023-05-02 ENCOUNTER — Ambulatory Visit
Admission: EM | Admit: 2023-05-02 | Discharge: 2023-05-02 | Disposition: A | Discharge status: Home / Self Care | Payer: Medicaid Other | Attending: Emergency Medicine | Admitting: Emergency Medicine

## 2023-05-02 ENCOUNTER — Other Ambulatory Visit: Payer: Self-pay

## 2023-05-02 VITALS — BP 122/79 | HR 88 | Temp 98.2°F | Resp 20 | Ht 63.0 in | Wt 270.0 lb

## 2023-05-02 DIAGNOSIS — R059 Cough, unspecified: Secondary | ICD-10-CM | POA: Diagnosis not present

## 2023-05-02 DIAGNOSIS — Z20828 Contact with and (suspected) exposure to other viral communicable diseases: Secondary | ICD-10-CM | POA: Insufficient documentation

## 2023-05-02 DIAGNOSIS — F17210 Nicotine dependence, cigarettes, uncomplicated: Secondary | ICD-10-CM | POA: Diagnosis not present

## 2023-05-02 DIAGNOSIS — J45909 Unspecified asthma, uncomplicated: Secondary | ICD-10-CM | POA: Diagnosis not present

## 2023-05-02 DIAGNOSIS — J069 Acute upper respiratory infection, unspecified: Secondary | ICD-10-CM | POA: Diagnosis not present

## 2023-05-02 DIAGNOSIS — B9789 Other viral agents as the cause of diseases classified elsewhere: Secondary | ICD-10-CM | POA: Diagnosis not present

## 2023-05-02 DIAGNOSIS — Z1152 Encounter for screening for COVID-19: Secondary | ICD-10-CM | POA: Insufficient documentation

## 2023-05-02 NOTE — ED Provider Notes (Signed)
Chelsea Wade    CSN: [id number] Arrival date & time: 05/02/23  3474      History   Chief Complaint Chief Complaint  Patient presents with   Nasal Congestion    headache, fatigue. - Entered by patient    HPI Chelsea Wade is a 22 y.o. female.   Patient presents for evaluation of a productive cough, nasal congestion, chills, body aches, intermittent bilateral ear fullness, intermittent generalized headaches, nausea without vomiting and soft stools present for 1 day.  Known sick contact with exposure to influenza.  Attempted to go to work but was sent home.  Tolerating food and liquids.  Has attempted use of NyQuil.  History of seasonal allergies and asthma, current smoker.  Denies shortness of breath and wheezing.  Past Medical History:  Diagnosis Date   ADHD (attention deficit hyperactivity disorder)    Allergic rhinitis    Anxiety disorder    Asthma    BMI 45.0-49.9, adult (HCC)    Convulsions/seizures (HCC) 11/10/2015   Depression    Drug overdose 11/10/2015   Intentional overdose of drug in tablet form (HCC) 11/10/2015   MDD (major depressive disorder), recurrent episode, severe (HCC) 11/12/2015   MDD (major depressive disorder), severe (HCC) 09/30/2017   Suicide attempt (HCC)    Tachycardia     Patient Active Problem List   Diagnosis Date Noted   ADHD (attention deficit hyperactivity disorder) 11/22/2022   Allergic rhinitis 11/22/2022   Anxiety disorder 11/22/2022   Asthma 11/22/2022   BMI 45.0-49.9, adult (HCC) 11/22/2022   Depression 11/22/2022   Suicide attempt (HCC) 11/22/2022   Tachycardia 11/22/2022   MDD (major depressive disorder), severe (HCC) 09/30/2017   MDD (major depressive disorder), recurrent episode, severe (HCC) 11/12/2015   Drug overdose 11/10/2015   Intentional overdose of drug in tablet form (HCC) 11/10/2015   Convulsions/seizures (HCC) 11/10/2015    Past Surgical History:  Procedure Laterality Date   TONSILLECTOMY      OB  History   No obstetric history on file.      Home Medications    Prior to Admission medications   Medication Sig Start Date End Date Taking? Authorizing Provider  albuterol (VENTOLIN HFA) 108 (90 Base) MCG/ACT inhaler Inhale 1-2 puffs into the lungs every 6 (six) hours as needed for wheezing or shortness of breath. 06/07/22   Gustavus Bryant, FNP  ARIPiprazole (ABILIFY) 5 MG tablet Take 1 tablet (5 mg total) by mouth daily. 11/25/22 11/25/23  Myrlene Broker, MD  benzonatate (TESSALON) 100 MG capsule Take 1 capsule (100 mg total) by mouth every 8 (eight) hours as needed for cough. 06/07/22   Gustavus Bryant, FNP  busPIRone (BUSPAR) 10 MG tablet Take 1 tablet (10 mg total) by mouth 3 (three) times daily. 11/25/22   Myrlene Broker, MD  clonazePAM (KLONOPIN) 1 MG tablet Take 1 tablet (1 mg total) by mouth daily as needed for anxiety. 11/25/22 11/25/23  Myrlene Broker, MD  cyclobenzaprine (FLEXERIL) 10 MG tablet Take 5-10 mg by mouth 3 (three) times daily as needed for muscle spasms. 10/18/22   [provider]  INCASSIA 0.35 MG tablet Take 1 tablet by mouth daily. 11/21/22   [provider]  ipratropium (ATROVENT) 0.03 % nasal spray Place 2 sprays into both nostrils every 12 (twelve) hours. 01/30/23   Valinda Hoar, NP  methylphenidate (CONCERTA) 36 MG PO CR tablet Take 1 tablet (36 mg total) by mouth daily. 11/25/22 11/25/23  Myrlene Broker, MD  metoprolol succinate (TOPROL-XL) 25 MG 24 hr tablet Take 12.5 mg by mouth daily. 11/08/22   [provider]  PARoxetine (PAXIL) 20 MG tablet Take 1 tablet (20 mg total) by mouth daily. 11/25/22 11/25/23  Myrlene Broker, MD  Vitamin D, Ergocalciferol, 50000 units CAPS Take 1 capsule by mouth once a week. 10/30/22   [provider]    Family History Family History  Problem Relation Age of Onset   Depression Mother    Bipolar disorder Father    Mental illness Father    Alcohol abuse Father    Schizophrenia Maternal Grandfather     Alcohol abuse Maternal Grandfather     Social History Social History   Tobacco Use   Smoking status: Every Day    Current packs/day: 0.25    Types: Cigarettes   Smokeless tobacco: Never  Vaping Use   Vaping status: Every Day  Substance Use Topics   Alcohol use: Yes    Alcohol/week: 3.0 standard drinks of alcohol    Types: 3 Shots of liquor per week    Comment: occ   Drug use: Yes    Frequency: 3.0 times per week    Types: Marijuana     Allergies   Patient has no known allergies.   Review of Systems Review of Systems   Physical Exam Triage Vital Signs ED Triage Vitals  Encounter Vitals Group     BP 05/02/23 0955 122/79     Systolic BP Percentile --      Diastolic BP Percentile --      Pulse Rate 05/02/23 0954 88     Resp 05/02/23 0954 20     Temp 05/02/23 0954 98.2 F (36.8 C)     Temp Source 05/02/23 0954 Oral     SpO2 05/02/23 0954 99 %     Weight 05/02/23 0955 270 lb (122.5 kg)     Height 05/02/23 0955 5\' 3"  (1.6 m)     Head Circumference --      Peak Flow --      Pain Score 05/02/23 0955 4     Pain Loc --      Pain Education --      Exclude from Growth Chart --    No data found.  Updated Vital Signs BP 122/79   Pulse 88   Temp 98.2 F (36.8 C) (Oral)   Resp 20   Ht 5\' 3"  (1.6 m)   Wt 270 lb (122.5 kg)   LMP 04/03/2023 (Approximate)   SpO2 99%   BMI 47.83 kg/m   Visual Acuity Right Eye Distance:   Left Eye Distance:   Bilateral Distance:    Right Eye Near:   Left Eye Near:    Bilateral Near:     Physical Exam Constitutional:      Appearance: Normal appearance.  HENT:     Head: Normocephalic.     Right Ear: Tympanic membrane, ear canal and external ear normal.     Left Ear: Tympanic membrane, ear canal and external ear normal.     Nose: Congestion present. No rhinorrhea.     Mouth/Throat:     Mouth: Mucous membranes are moist.     Pharynx: Oropharynx is clear. No oropharyngeal exudate or posterior oropharyngeal erythema.  Eyes:      Extraocular Movements: Extraocular movements intact.  Cardiovascular:     Rate and Rhythm: Normal rate and regular rhythm.     Pulses: Normal pulses.     Heart sounds:  Normal heart sounds.  Pulmonary:     Effort: Pulmonary effort is normal.     Breath sounds: Normal breath sounds.  Musculoskeletal:     Cervical back: Normal range of motion and neck supple.  Neurological:     Mental Status: She is alert and oriented to person, place, and time. Mental status is at baseline.      UC Treatments / Results  Labs (all labs ordered are listed, but only abnormal results are displayed) Labs Reviewed  SARS CORONAVIRUS 2 (TAT 6-24 HRS)    EKG   Radiology No results found.  Procedures Procedures (including critical care time)  Medications Ordered in UC Medications - No data to display  Initial Impression / Assessment and Plan / UC Course  I have reviewed the triage vital signs and the nursing notes.  Pertinent labs & imaging results that were available during my care of the patient were reviewed by me and considered in my medical decision making (see chart for details).  Viral URI with cough  Patient is in no signs of distress nor toxic appearing.  Vital signs are stable.  Low suspicion for pneumonia, pneumothorax or bronchitis and therefore will defer imaging.COVID test is pending, reviewed quarantine guidelines per CDC recommendations, even though she has asthma as a young healthy adult, should be able to manage without antiviral advise follow-up for any shortness of breath or wheezing. May use additional over-the-counter medications as needed for supportive care.  May follow-up with urgent care as needed if symptoms persist or worsen.  Note given.   Final Clinical Impressions(s) / UC Diagnoses   Final diagnoses:  Viral URI with cough     Discharge Instructions      Your symptoms today are most likely being caused by a virus and should steadily improve in time it can  take up to 7 to 10 days before you truly start to see a turnaround however things will get better  Covid Test pending up to 24 hours, you will be notified of positive test results only, if positive you will need to quarantine if experiencing fever, if no fever may continue activity wearing mask    You can take Tylenol and/or Ibuprofen as needed for fever reduction and pain relief.   For cough: honey 1/2 to 1 teaspoon (you can dilute the honey in water or another fluid).  You can also use guaifenesin and dextromethorphan for cough. You can use a humidifier for chest congestion and cough.  If you don't have a humidifier, you can sit in the bathroom with the hot shower running.      For sore throat: try warm salt water gargles, cepacol lozenges, throat spray, warm tea or water with lemon/honey, popsicles or ice, or OTC cold relief medicine for throat discomfort.   For congestion: take a daily anti-histamine like Zyrtec, Claritin, and a oral decongestant, such as pseudoephedrine.  You can also use Flonase 1-2 sprays in each nostril daily.   It is important to stay hydrated: drink plenty of fluids (water, gatorade/powerade/pedialyte, juices, or teas) to keep your throat moisturized and help further relieve irritation/discomfort.    ED Prescriptions   None    PDMP not reviewed this encounter.   Valinda Hoar, NP 05/02/23 1013

## 2023-05-02 NOTE — Discharge Instructions (Signed)
Your symptoms today are most likely being caused by a virus and should steadily improve in time it can take up to 7 to 10 days before you truly start to see a turnaround however things will get better  Covid Test pending up to 24 hours, you will be notified of positive test results only, if positive you will need to quarantine if experiencing fever, if no fever may continue activity wearing mask    You can take Tylenol and/or Ibuprofen as needed for fever reduction and pain relief.   For cough: honey 1/2 to 1 teaspoon (you can dilute the honey in water or another fluid).  You can also use guaifenesin and dextromethorphan for cough. You can use a humidifier for chest congestion and cough.  If you don't have a humidifier, you can sit in the bathroom with the hot shower running.      For sore throat: try warm salt water gargles, cepacol lozenges, throat spray, warm tea or water with lemon/honey, popsicles or ice, or OTC cold relief medicine for throat discomfort.   For congestion: take a daily anti-histamine like Zyrtec, Claritin, and a oral decongestant, such as pseudoephedrine.  You can also use Flonase 1-2 sprays in each nostril daily.   It is important to stay hydrated: drink plenty of fluids (water, gatorade/powerade/pedialyte, juices, or teas) to keep your throat moisturized and help further relieve irritation/discomfort.

## 2023-05-02 NOTE — ED Triage Notes (Signed)
Pt woke up feeling bad yesterday and went to work but was sent home.  Pt having congestions, runny nose, sore throat, fatigue, body aches.  Pt would like to be tested for covid.  Also reports being around someone at school who had flu. No fevers.

## 2023-05-03 ENCOUNTER — Emergency Department: Payer: Medicaid Other

## 2023-05-03 ENCOUNTER — Emergency Department
Admission: EM | Admit: 2023-05-03 | Discharge: 2023-05-03 | Discharge status: Against Medical Advice | Payer: Medicaid Other | Attending: Emergency Medicine | Admitting: Emergency Medicine

## 2023-05-03 ENCOUNTER — Other Ambulatory Visit: Payer: Self-pay

## 2023-05-03 DIAGNOSIS — R059 Cough, unspecified: Secondary | ICD-10-CM | POA: Insufficient documentation

## 2023-05-03 DIAGNOSIS — J45909 Unspecified asthma, uncomplicated: Secondary | ICD-10-CM | POA: Diagnosis not present

## 2023-05-03 DIAGNOSIS — R079 Chest pain, unspecified: Secondary | ICD-10-CM | POA: Diagnosis not present

## 2023-05-03 DIAGNOSIS — M791 Myalgia, unspecified site: Secondary | ICD-10-CM | POA: Insufficient documentation

## 2023-05-03 DIAGNOSIS — Z1152 Encounter for screening for COVID-19: Secondary | ICD-10-CM | POA: Diagnosis not present

## 2023-05-03 DIAGNOSIS — R0981 Nasal congestion: Secondary | ICD-10-CM | POA: Diagnosis not present

## 2023-05-03 DIAGNOSIS — Z5321 Procedure and treatment not carried out due to patient leaving prior to being seen by health care provider: Secondary | ICD-10-CM | POA: Diagnosis not present

## 2023-05-03 LAB — RESP PANEL BY RT-PCR (RSV, FLU A&B, COVID)  RVPGX2
Influenza A by PCR: NEGATIVE
Influenza B by PCR: NEGATIVE
Resp Syncytial Virus by PCR: NEGATIVE
SARS Coronavirus 2 by RT PCR: NEGATIVE

## 2023-05-03 LAB — COMPREHENSIVE METABOLIC PANEL
ALT: 13 U/L (ref 0–44)
AST: 14 U/L — ABNORMAL LOW (ref 15–41)
Albumin: 3.8 g/dL (ref 3.5–5.0)
Alkaline Phosphatase: 98 U/L (ref 38–126)
Anion gap: 7 (ref 5–15)
BUN: 10 mg/dL (ref 6–20)
CO2: 21 mmol/L — ABNORMAL LOW (ref 22–32)
Calcium: 8.4 mg/dL — ABNORMAL LOW (ref 8.9–10.3)
Chloride: 106 mmol/L (ref 98–111)
Creatinine, Ser: 0.55 mg/dL (ref 0.44–1.00)
GFR, Estimated: >60 mL/min (ref >60)
Glucose, Bld: 143 mg/dL — ABNORMAL HIGH (ref 70–99)
Potassium: 3.6 mmol/L (ref 3.5–5.1)
Sodium: 134 mmol/L — ABNORMAL LOW (ref 135–145)
Total Bilirubin: 0.7 mg/dL (ref <1.2)
Total Protein: 7.3 g/dL (ref 6.5–8.1)

## 2023-05-03 LAB — CBC
HCT: 38.3 % (ref 36.0–46.0)
Hemoglobin: 12.3 g/dL (ref 12.0–15.0)
MCH: 29.4 pg (ref 26.0–34.0)
MCHC: 32.1 g/dL (ref 30.0–36.0)
MCV: 91.4 fL (ref 80.0–100.0)
Platelets: 245 10*3/uL (ref 150–400)
RBC: 4.19 MIL/uL (ref 3.87–5.11)
RDW: 12.9 % (ref 11.5–15.5)
WBC: 6.6 10*3/uL (ref 4.0–10.5)
nRBC: 0 % (ref 0.0–0.2)

## 2023-05-03 LAB — TROPONIN I (HIGH SENSITIVITY): Troponin I (High Sensitivity): 3 ng/L (ref <18)

## 2023-05-03 LAB — SARS CORONAVIRUS 2 (TAT 6-24 HRS): SARS Coronavirus 2: NEGATIVE

## 2023-05-03 MED ORDER — IPRATROPIUM-ALBUTEROL 0.5-2.5 (3) MG/3ML IN SOLN
3.0000 mL | Freq: Once | RESPIRATORY_TRACT | Status: AC
  Administered 2023-05-03: 3 mL via RESPIRATORY_TRACT

## 2023-05-03 MED ORDER — IPRATROPIUM-ALBUTEROL 0.5-2.5 (3) MG/3ML IN SOLN
RESPIRATORY_TRACT | Status: AC
  Filled 2023-05-03: qty 3

## 2023-05-03 NOTE — ED Triage Notes (Signed)
Pt reports cough, congestion, body aches x 1 wk. Hx of asthma. Upper wheeze noted and croup/barky cough observed. Non productive. Reports chest pain acute onset tonight described as a pressure on her chest. Pt ambulatory to triage. Alert and oriented. Breathing unlabored speaking in full sentences. Symmetric chest rise and fall.

## 2023-05-04 ENCOUNTER — Other Ambulatory Visit: Payer: Self-pay

## 2023-05-04 ENCOUNTER — Emergency Department: Payer: Medicaid Other

## 2023-05-04 ENCOUNTER — Emergency Department
Admission: EM | Admit: 2023-05-04 | Discharge: 2023-05-04 | Disposition: A | Discharge status: Home / Self Care | Payer: Medicaid Other | Attending: Emergency Medicine | Admitting: Emergency Medicine

## 2023-05-04 ENCOUNTER — Ambulatory Visit: Payer: Medicaid Other

## 2023-05-04 DIAGNOSIS — J9811 Atelectasis: Secondary | ICD-10-CM | POA: Diagnosis not present

## 2023-05-04 DIAGNOSIS — J4521 Mild intermittent asthma with (acute) exacerbation: Secondary | ICD-10-CM | POA: Insufficient documentation

## 2023-05-04 DIAGNOSIS — R079 Chest pain, unspecified: Secondary | ICD-10-CM | POA: Diagnosis not present

## 2023-05-04 DIAGNOSIS — J069 Acute upper respiratory infection, unspecified: Secondary | ICD-10-CM | POA: Insufficient documentation

## 2023-05-04 DIAGNOSIS — R059 Cough, unspecified: Secondary | ICD-10-CM | POA: Diagnosis present

## 2023-05-04 DIAGNOSIS — R0789 Other chest pain: Secondary | ICD-10-CM | POA: Diagnosis not present

## 2023-05-04 LAB — CBC WITH DIFFERENTIAL/PLATELET
Abs Immature Granulocytes: 0.04 10*3/uL (ref 0.00–0.07)
Basophils Absolute: 0 10*3/uL (ref 0.0–0.1)
Basophils Relative: 0 %
Eosinophils Absolute: 0.2 10*3/uL (ref 0.0–0.5)
Eosinophils Relative: 3 %
HCT: 38.1 % (ref 36.0–46.0)
Hemoglobin: 12.5 g/dL (ref 12.0–15.0)
Immature Granulocytes: 1 %
Lymphocytes Relative: 26 %
Lymphs Abs: 1.7 10*3/uL (ref 0.7–4.0)
MCH: 29.7 pg (ref 26.0–34.0)
MCHC: 32.8 g/dL (ref 30.0–36.0)
MCV: 90.5 fL (ref 80.0–100.0)
Monocytes Absolute: 0.4 10*3/uL (ref 0.1–1.0)
Monocytes Relative: 7 %
Neutro Abs: 4.2 10*3/uL (ref 1.7–7.7)
Neutrophils Relative %: 63 %
Platelets: 242 10*3/uL (ref 150–400)
RBC: 4.21 MIL/uL (ref 3.87–5.11)
RDW: 13.1 % (ref 11.5–15.5)
WBC: 6.7 10*3/uL (ref 4.0–10.5)
nRBC: 0 % (ref 0.0–0.2)

## 2023-05-04 LAB — COMPREHENSIVE METABOLIC PANEL
ALT: 15 U/L (ref 0–44)
AST: 11 U/L — ABNORMAL LOW (ref 15–41)
Albumin: 3.4 g/dL — ABNORMAL LOW (ref 3.5–5.0)
Alkaline Phosphatase: 87 U/L (ref 38–126)
Anion gap: 8 (ref 5–15)
BUN: 9 mg/dL (ref 6–20)
CO2: 19 mmol/L — ABNORMAL LOW (ref 22–32)
Calcium: 7.9 mg/dL — ABNORMAL LOW (ref 8.9–10.3)
Chloride: 111 mmol/L (ref 98–111)
Creatinine, Ser: 0.44 mg/dL (ref 0.44–1.00)
GFR, Estimated: >60 mL/min (ref >60)
Glucose, Bld: 119 mg/dL — ABNORMAL HIGH (ref 70–99)
Potassium: 3.6 mmol/L (ref 3.5–5.1)
Sodium: 138 mmol/L (ref 135–145)
Total Bilirubin: 0.7 mg/dL (ref <1.2)
Total Protein: 6.7 g/dL (ref 6.5–8.1)

## 2023-05-04 LAB — TROPONIN I (HIGH SENSITIVITY): Troponin I (High Sensitivity): <2 ng/L (ref <18)

## 2023-05-04 LAB — D-DIMER, QUANTITATIVE: D-Dimer, Quant: 0.38 ug{FEU}/mL (ref 0.00–0.50)

## 2023-05-04 MED ORDER — COMPRESSOR/NEBULIZER MISC: 1.0000 [IU] | 0 refills | Status: AC | PRN

## 2023-05-04 MED ORDER — ALBUTEROL SULFATE (2.5 MG/3ML) 0.083% IN NEBU: 2.5000 mg | INHALATION_SOLUTION | Freq: Four times a day (QID) | RESPIRATORY_TRACT | 1 refills | Status: AC | PRN

## 2023-05-04 MED ORDER — IPRATROPIUM-ALBUTEROL 0.5-2.5 (3) MG/3ML IN SOLN
3.0000 mL | Freq: Once | RESPIRATORY_TRACT | Status: AC
  Administered 2023-05-04: 3 mL via RESPIRATORY_TRACT
  Filled 2023-05-04: qty 3

## 2023-05-04 MED ORDER — PREDNISONE 20 MG PO TABS: 40.0000 mg | ORAL_TABLET | Freq: Every day | ORAL | 0 refills | Status: AC

## 2023-05-04 MED ORDER — HYDROCODONE BIT-HOMATROP MBR 5-1.5 MG/5ML PO SOLN
5.0000 mL | ORAL | Status: AC
  Administered 2023-05-04: 5 mL via ORAL

## 2023-05-04 MED ORDER — PREDNISONE 20 MG PO TABS
40.0000 mg | ORAL_TABLET | Freq: Once | ORAL | Status: AC
  Administered 2023-05-04: 40 mg via ORAL
  Filled 2023-05-04: qty 2

## 2023-05-04 MED ORDER — HYDROCODONE BIT-HOMATROP MBR 5-1.5 MG/5ML PO SOLN: 5.0000 mL | Freq: Three times a day (TID) | ORAL | 0 refills | Status: AC | PRN

## 2023-05-04 NOTE — ED Notes (Signed)
Called pharmacy for missing Hycodan med

## 2023-05-04 NOTE — ED Triage Notes (Signed)
Pt arrived POV for continued symptoms as reported last night of CP, cough, SOB, nausea, and congestion for one week. Pt had EKG, CXR, labs and flu/covid/rsv panel which was negative. Pt stated she eloped, woke up this am with continued CP, also reports hx of anxiety. A&O x4, NAD noted, VSS, afebrile.

## 2023-05-04 NOTE — ED Provider Notes (Signed)
[facility] Provider Note    Event Date/Time   First MD Initiated Contact with Patient 05/04/23 0701     (approximate)   History   Chest Pain   HPI  Chelsea Wade is a 22 y.o. female history of asthma, ADHD, recent diagnosis of viral syndrome  For 3 days been experiencing cough congestion.  She was told she has upper respiratory infection urgent care.  She reports ongoing symptoms feels like a tightness in her chest she reports she feels like her asthma is ongoing.  She is not currently taking any prednisone.  Just using as use as needed albuterol  She had fevers earlier.  Slight shortness of breath feels like a tightness in her chest like her asthma  No history of blood clots no leg swelling.  Takes birth control but only of a progesterone nature.  Denies pregnancy     Physical Exam   Triage Vital Signs: ED Triage Vitals  Encounter Vitals Group     BP 05/04/23 0629 (!) 131/93     Systolic BP Percentile --      Diastolic BP Percentile --      Pulse Rate 05/04/23 0629 100     Resp 05/04/23 0629 20     Temp 05/04/23 0629 98.4 F (36.9 C)     Temp Source 05/04/23 0629 Oral     SpO2 05/04/23 0629 97 %     Weight 05/04/23 0627 270 lb (122.5 kg)     Height 05/04/23 0627 5\' 3"  (1.6 m)     Head Circumference --      Peak Flow --      Pain Score 05/04/23 0626 5     Pain Loc --      Pain Education --      Exclude from Growth Chart --     Most recent vital signs: Vitals:   05/04/23 0830 05/04/23 0930  BP: 122/72 (!) 121/109  Pulse:    Resp:    Temp:    SpO2:       General: Awake, no distress.  She is very pleasant, her fianc also at the bedside very pleasant. CV:  Good peripheral perfusion.  Very slight tachycardia with normal tone Resp:  Normal effort.  She has mild expiratory wheezing through all fields.  No rales.  Slight central rhonchi with cough. Tympanic membranes normal bilaterally.  No overt large amount of sinus drainage.   Posterior oropharynx slightly injected Abd:  No distention.  Other:  No edema or leg swelling   ED Results / Procedures / Treatments   Labs (all labs ordered are listed, but only abnormal results are displayed) Labs Reviewed  COMPREHENSIVE METABOLIC PANEL - Abnormal; Notable for the following components:      Result Value   CO2 19 (*)    Glucose, Bld 119 (*)    Calcium 7.9 (*)    Albumin 3.4 (*)    AST 11 (*)    All other components within normal limits  CBC WITH DIFFERENTIAL/PLATELET  D-DIMER, QUANTITATIVE  TROPONIN I (HIGH SENSITIVITY)   Also notable, yesterday evening, patient was seen and had labs performed which demonstrated a normal troponin, negative flu and influenza testing, normal CBC, negative COVID influenza and RSV testing  EKG  EKG interpreted by me at 6:30 AM heart rate 105 QRS 90 QTc 460 Sinus tachycardia no evidence ischemia   RADIOLOGY   Chest x-ray interpreted by me as negative for acute  DG Chest 2  View  Result Date: 05/04/2023 CLINICAL DATA:  Chest pain EXAM: CHEST - 2 VIEW COMPARISON:  05/03/2023 FINDINGS: The lungs are clear without focal pneumonia, edema, pneumothorax or pleural effusion. Linear density at the left base is compatible with atelectasis. The cardiopericardial silhouette is within normal limits for size. No acute bony abnormality. Telemetry leads overlie the chest. IMPRESSION: Left base atelectasis. Otherwise no acute cardiopulmonary findings. Electronically Signed   By: Kennith Center M.D.   On: 05/04/2023 08:20   DG Chest 2 View  Result Date: 05/03/2023 CLINICAL DATA:  Chest pain EXAM: CHEST - 2 VIEW COMPARISON:  Chest x-ray 06/08/2022 FINDINGS: The heart size and mediastinal contours are within normal limits. Both lungs are clear. The visualized skeletal structures are unremarkable. IMPRESSION: No active cardiopulmonary disease. Electronically Signed   By: Darliss Cheney M.D.   On: 05/03/2023 21:14      PROCEDURES:  Critical Care  performed: No  Procedures   MEDICATIONS ORDERED IN ED: Medications  predniSONE (DELTASONE) tablet 40 mg (40 mg Oral Given 05/04/23 0734)  ipratropium-albuterol (DUONEB) 0.5-2.5 (3) MG/3ML nebulizer solution 3 mL (3 mLs Nebulization Given 05/04/23 0734)  ipratropium-albuterol (DUONEB) 0.5-2.5 (3) MG/3ML nebulizer solution 3 mL (3 mLs Nebulization Given 05/04/23 0734)  HYDROcodone bit-homatropine (HYCODAN) 5-1.5 MG/5ML syrup 5 mL (5 mLs Oral Given 05/04/23 0854)     IMPRESSION / MDM / ASSESSMENT AND PLAN / ED COURSE  I reviewed the triage vital signs and the nursing notes.                              Differential diagnosis includes, but is not limited to, most likely upper respiratory syndrome.  No evidence of ACS.  She reports chest tightness consistent with her prior asthma she has had previous negative troponin.  Her troponin testing today is also normal.  Chest x-ray clear.  Symptoms most consistent with mild asthma.  She has no distress but does demonstrate wheezing.  Will start her on a steroid including prednisone which she reports taking in the past, trial Hycodan for cough as patient reports frequent cough keeping her up at night, Tessalon Perles not helpful historically, also will prescribe nebulizer that she could use at the house as well as albuterol for that she does have albuterol on hand.  No indication for antibiotic  No pneumothorax no dissection no PE suspected.  Will send D-dimer to exclude PE given the persistence of symptoms, but pretest probability is quite low  D-dimer normal  Overall, she is in no acute distress, she appears very pleasant, suspect mild aspect of my exacerbation secondary to upper respiratory infection    Patient's presentation is most consistent with acute complicated illness / injury requiring diagnostic workup.   ----------------------------------------- 9:15 AM on 05/04/2023 -----------------------------------------  Patient reports  feeling improved, work of breathing back to normal.  Chest tightness improved.  Her lung sounds are now clear.  She appears much improved also reports that the medication has helped with her cough.  She looks well at this time reports her symptoms much better.  Appears appropriate for outpatient care and treatment.   Return precautions and treatment recommendations and follow-up discussed with the patient who is agreeable with the plan.  Patient very pleasant agreeable with plan for discharge     FINAL CLINICAL IMPRESSION(S) / ED DIAGNOSES   Final diagnoses:  Upper respiratory virus  Mild intermittent asthma with exacerbation     Rx / DC  Orders   ED Discharge Orders          Ordered    HYDROcodone bit-homatropine (HYCODAN) 5-1.5 MG/5ML syrup  Every 8 hours PRN        05/04/23 0732    predniSONE (DELTASONE) 20 MG tablet  Daily with breakfast        05/04/23 0732    Nebulizers (COMPRESSOR/NEBULIZER) MISC  As needed        05/04/23 0732    albuterol (PROVENTIL) (2.5 MG/3ML) 0.083% nebulizer solution  Every 6 hours PRN        05/04/23 0732             Note:  This document was prepared using Dragon voice recognition software and may include unintentional dictation errors.   Sharyn Creamer, MD 05/04/23 [phone]

## 2023-05-04 NOTE — ED Notes (Signed)
Messaged pharmacy for [employer].

## 2023-05-04 NOTE — Discharge Instructions (Addendum)
Do not place yourself in a dangerous environment, or work within 8 hours of use of hydrocodone syrup.  Use only as prescribed

## 2023-05-04 NOTE — ED Notes (Signed)
Called lab for update on D dimer

## 2023-05-05 DIAGNOSIS — J45901 Unspecified asthma with (acute) exacerbation: Secondary | ICD-10-CM | POA: Diagnosis not present

## 2023-05-05 DIAGNOSIS — Z6841 Body Mass Index (BMI) 40.0 and over, adult: Secondary | ICD-10-CM | POA: Diagnosis not present

## 2023-05-05 DIAGNOSIS — Z131 Encounter for screening for diabetes mellitus: Secondary | ICD-10-CM | POA: Diagnosis not present

## 2023-05-05 DIAGNOSIS — Z20828 Contact with and (suspected) exposure to other viral communicable diseases: Secondary | ICD-10-CM | POA: Diagnosis not present

## 2023-05-05 DIAGNOSIS — R635 Abnormal weight gain: Secondary | ICD-10-CM | POA: Diagnosis not present

## 2023-05-05 DIAGNOSIS — R519 Headache, unspecified: Secondary | ICD-10-CM | POA: Diagnosis not present

## 2023-05-05 DIAGNOSIS — R110 Nausea: Secondary | ICD-10-CM | POA: Diagnosis not present

## 2023-05-05 DIAGNOSIS — J069 Acute upper respiratory infection, unspecified: Secondary | ICD-10-CM | POA: Diagnosis not present

## 2023-05-05 DIAGNOSIS — R059 Cough, unspecified: Secondary | ICD-10-CM | POA: Diagnosis not present

## 2023-05-05 DIAGNOSIS — Z1159 Encounter for screening for other viral diseases: Secondary | ICD-10-CM | POA: Diagnosis not present

## 2023-05-05 DIAGNOSIS — F419 Anxiety disorder, unspecified: Secondary | ICD-10-CM | POA: Diagnosis not present

## 2023-05-05 DIAGNOSIS — R062 Wheezing: Secondary | ICD-10-CM | POA: Diagnosis not present

## 2023-05-13 DIAGNOSIS — Z8709 Personal history of other diseases of the respiratory system: Secondary | ICD-10-CM | POA: Diagnosis not present

## 2023-05-13 DIAGNOSIS — R053 Chronic cough: Secondary | ICD-10-CM | POA: Diagnosis not present

## 2023-05-13 DIAGNOSIS — J189 Pneumonia, unspecified organism: Secondary | ICD-10-CM | POA: Diagnosis not present

## 2023-05-25 DIAGNOSIS — Z419 Encounter for procedure for purposes other than remedying health state, unspecified: Secondary | ICD-10-CM | POA: Diagnosis not present

## 2023-06-04 ENCOUNTER — Encounter: Payer: Self-pay | Admitting: Emergency Medicine

## 2023-06-04 ENCOUNTER — Other Ambulatory Visit: Payer: Self-pay

## 2023-06-04 ENCOUNTER — Emergency Department
Admission: EM | Admit: 2023-06-04 | Discharge: 2023-06-05 | Disposition: A | Discharge status: Home / Self Care | Payer: Medicaid Other | Attending: Emergency Medicine | Admitting: Emergency Medicine

## 2023-06-04 DIAGNOSIS — R000 Tachycardia, unspecified: Secondary | ICD-10-CM | POA: Diagnosis not present

## 2023-06-04 DIAGNOSIS — J45909 Unspecified asthma, uncomplicated: Secondary | ICD-10-CM | POA: Insufficient documentation

## 2023-06-04 DIAGNOSIS — R109 Unspecified abdominal pain: Secondary | ICD-10-CM | POA: Diagnosis present

## 2023-06-04 DIAGNOSIS — L0882 Omphalitis not of newborn: Secondary | ICD-10-CM | POA: Diagnosis not present

## 2023-06-04 DIAGNOSIS — Z79899 Other long term (current) drug therapy: Secondary | ICD-10-CM | POA: Diagnosis not present

## 2023-06-04 LAB — CBC
HCT: 35.4 % — ABNORMAL LOW (ref 36.0–46.0)
Hemoglobin: 11.5 g/dL — ABNORMAL LOW (ref 12.0–15.0)
MCH: 29.6 pg (ref 26.0–34.0)
MCHC: 32.5 g/dL (ref 30.0–36.0)
MCV: 91.2 fL (ref 80.0–100.0)
Platelets: 250 10*3/uL (ref 150–400)
RBC: 3.88 MIL/uL (ref 3.87–5.11)
RDW: 13.3 % (ref 11.5–15.5)
WBC: 9.6 10*3/uL (ref 4.0–10.5)
nRBC: 0 % (ref 0.0–0.2)

## 2023-06-04 LAB — URINALYSIS, ROUTINE W REFLEX MICROSCOPIC
Bilirubin Urine: NEGATIVE
Glucose, UA: NEGATIVE mg/dL
Hgb urine dipstick: NEGATIVE
Ketones, ur: NEGATIVE mg/dL
Nitrite: NEGATIVE
Protein, ur: NEGATIVE mg/dL
Specific Gravity, Urine: 1.023 (ref 1.005–1.030)
pH: 6 (ref 5.0–8.0)

## 2023-06-04 LAB — COMPREHENSIVE METABOLIC PANEL
ALT: 14 U/L (ref 0–44)
AST: 12 U/L — ABNORMAL LOW (ref 15–41)
Albumin: 3.7 g/dL (ref 3.5–5.0)
Alkaline Phosphatase: 95 U/L (ref 38–126)
Anion gap: 4 — ABNORMAL LOW (ref 5–15)
BUN: 11 mg/dL (ref 6–20)
CO2: 24 mmol/L (ref 22–32)
Calcium: 8.7 mg/dL — ABNORMAL LOW (ref 8.9–10.3)
Chloride: 109 mmol/L (ref 98–111)
Creatinine, Ser: 0.68 mg/dL (ref 0.44–1.00)
GFR, Estimated: >60 mL/min (ref >60)
Glucose, Bld: 170 mg/dL — ABNORMAL HIGH (ref 70–99)
Potassium: 3.6 mmol/L (ref 3.5–5.1)
Sodium: 137 mmol/L (ref 135–145)
Total Bilirubin: 0.8 mg/dL (ref <1.2)
Total Protein: 6.8 g/dL (ref 6.5–8.1)

## 2023-06-04 LAB — POC URINE PREG, ED: Preg Test, Ur: NEGATIVE

## 2023-06-04 LAB — LIPASE, BLOOD: Lipase: 31 U/L (ref 11–51)

## 2023-06-04 NOTE — ED Triage Notes (Signed)
Patient ambulatory to triage with complaints of abdominal pain starting last night and turning into pain specifically around the umbilicus and now states that she noticed dried blood around it tonight as well. She endorses nausea, no vomiting.

## 2023-06-05 MED ORDER — CEPHALEXIN 500 MG PO CAPS: 500.0000 mg | ORAL_CAPSULE | Freq: Four times a day (QID) | ORAL | 0 refills | Status: AC

## 2023-06-05 MED ORDER — IBUPROFEN 800 MG PO TABS
800.0000 mg | ORAL_TABLET | Freq: Three times a day (TID) | ORAL | 0 refills | Status: AC | PRN
Start: 2023-06-05 — End: ?

## 2023-06-05 MED ORDER — CEPHALEXIN 500 MG PO CAPS
500.0000 mg | ORAL_CAPSULE | Freq: Once | ORAL | Status: AC
  Administered 2023-06-05: 500 mg via ORAL
  Filled 2023-06-05: qty 1

## 2023-06-05 MED ORDER — HYDROCODONE-ACETAMINOPHEN 5-325 MG PO TABS
1.0000 | ORAL_TABLET | Freq: Once | ORAL | Status: AC
  Administered 2023-06-05: 1 via ORAL
  Filled 2023-06-05: qty 1

## 2023-06-05 MED ORDER — IBUPROFEN 800 MG PO TABS
800.0000 mg | ORAL_TABLET | Freq: Once | ORAL | Status: AC
  Administered 2023-06-05: 800 mg via ORAL
  Filled 2023-06-05: qty 1

## 2023-06-05 NOTE — Discharge Instructions (Addendum)
You may alternate Tylenol 1000 mg every 6 hours as needed for pain, fever and Ibuprofen 800 mg every 6-8 hours as needed for pain, fever.  Please take Ibuprofen with food.  Do not take more than 4000 mg of Tylenol (acetaminophen) in a 24 hour period. ° °

## 2023-06-05 NOTE — ED Notes (Signed)
Patient given discharge instructions including prescriptions x2 and importance of follow up appt as needed with stated understanding. Patient stable and ambulatory with steady even gait on dispo.

## 2023-06-05 NOTE — ED Provider Notes (Signed)
[facility] Provider Note    Event Date/Time   First MD Initiated Contact with Patient 06/04/23 2355     (approximate)   History   Abdominal Pain   HPI  Chelsea Wade is a 22 y.o. female with history of obesity, asthma, depression who presents to the emergency department with pain and drainage from the bellybutton today.  No fever.  No vomiting or diarrhea.  No piercing.   History provided by patient, significant other.    Past Medical History:  Diagnosis Date   ADHD (attention deficit hyperactivity disorder)    Allergic rhinitis    Anxiety disorder    Asthma    BMI 45.0-49.9, adult (HCC)    Convulsions/seizures (HCC) 11/10/2015   Depression    Drug overdose 11/10/2015   Intentional overdose of drug in tablet form (HCC) 11/10/2015   MDD (major depressive disorder), recurrent episode, severe (HCC) 11/12/2015   MDD (major depressive disorder), severe (HCC) 09/30/2017   Suicide attempt (HCC)    Tachycardia     Past Surgical History:  Procedure Laterality Date   TONSILLECTOMY      MEDICATIONS:  Prior to Admission medications   Medication Sig Start Date End Date Taking? Authorizing Provider  albuterol (PROVENTIL) (2.5 MG/3ML) 0.083% nebulizer solution Take 3 mLs (2.5 mg total) by nebulization every 6 (six) hours as needed for wheezing or shortness of breath. 05/04/23   Sharyn Creamer, MD  albuterol (VENTOLIN HFA) 108 (90 Base) MCG/ACT inhaler Inhale 1-2 puffs into the lungs every 6 (six) hours as needed for wheezing or shortness of breath. 06/07/22   Gustavus Bryant, FNP  ARIPiprazole (ABILIFY) 5 MG tablet Take 1 tablet (5 mg total) by mouth daily. 11/25/22 11/25/23  Myrlene Broker, MD  benzonatate (TESSALON) 100 MG capsule Take 1 capsule (100 mg total) by mouth every 8 (eight) hours as needed for cough. 06/07/22   Gustavus Bryant, FNP  busPIRone (BUSPAR) 10 MG tablet Take 1 tablet (10 mg total) by mouth 3 (three) times daily. 11/25/22   Myrlene Broker, MD  clonazePAM (KLONOPIN) 1 MG tablet Take 1 tablet (1 mg total) by mouth daily as needed for anxiety. 11/25/22 11/25/23  Myrlene Broker, MD  cyclobenzaprine (FLEXERIL) 10 MG tablet Take 5-10 mg by mouth 3 (three) times daily as needed for muscle spasms. 10/18/22   [provider]  HYDROcodone bit-homatropine (HYCODAN) 5-1.5 MG/5ML syrup Take 5 mLs by mouth every 8 (eight) hours as needed for cough. 05/04/23   Sharyn Creamer, MD  INCASSIA 0.35 MG tablet Take 1 tablet by mouth daily. 11/21/22   [provider]  ipratropium (ATROVENT) 0.03 % nasal spray Place 2 sprays into both nostrils every 12 (twelve) hours. 01/30/23   Valinda Hoar, NP  methylphenidate (CONCERTA) 36 MG PO CR tablet Take 1 tablet (36 mg total) by mouth daily. 11/25/22 11/25/23  Myrlene Broker, MD  metoprolol succinate (TOPROL-XL) 25 MG 24 hr tablet Take 12.5 mg by mouth daily. 11/08/22   [provider]  Nebulizers (COMPRESSOR/NEBULIZER) MISC 1 Units by Does not apply route as needed. 05/04/23   Sharyn Creamer, MD  PARoxetine (PAXIL) 20 MG tablet Take 1 tablet (20 mg total) by mouth daily. 11/25/22 11/25/23  Myrlene Broker, MD  Vitamin D, Ergocalciferol, 50000 units CAPS Take 1 capsule by mouth once a week. 10/30/22   [provider]    Physical Exam   Triage Vital Signs: ED Triage Vitals  Encounter Vitals Group  BP 06/04/23 1909 (!) 149/96     Systolic BP Percentile --      Diastolic BP Percentile --      Pulse Rate 06/04/23 1909 (!) 120     Resp 06/04/23 1909 18     Temp 06/04/23 1909 98.8 F (37.1 C)     Temp src --      SpO2 06/04/23 1909 97 %     Weight 06/04/23 1909 290 lb (131.5 kg)     Height 06/04/23 1909 5\' 3"  (1.6 m)     Head Circumference --      Peak Flow --      Pain Score 06/04/23 1916 6     Pain Loc --      Pain Education --      Exclude from Growth Chart --     Most recent vital signs: Vitals:   06/04/23 1909 06/05/23 0015  BP: (!) 149/96 122/81  Pulse: (!) 120 (!)  103  Resp: 18 17  Temp: 98.8 F (37.1 C) 98.4 F (36.9 C)  SpO2: 97% 95%    CONSTITUTIONAL: Alert, responds appropriately to questions. Well-appearing; well-nourished HEAD: Normocephalic, atraumatic EYES: Conjunctivae clear, pupils appear equal, sclera nonicteric ENT: normal nose; moist mucous membranes NECK: Supple, normal ROM CARD: Regular and tachycardic; S1 and S2 appreciated RESP: Normal chest excursion without splinting or tachypnea; breath sounds clear and equal bilaterally; no wheezes, no rhonchi, no rales, no hypoxia or respiratory distress, speaking full sentences ABD/GI: Non-distended; soft, non-tender, no rebound, no guarding, no peritoneal signs, patient has some tenderness inside of the umbilicus with small amount of blood-tinged drainage, no frank purulence, no fluctuance or crepitus or induration appreciated BACK: The back appears normal EXT: Normal ROM in all joints; no deformity noted, no edema SKIN: Normal color for age and race; warm; no rash on exposed skin NEURO: Moves all extremities equally, normal speech PSYCH: The patient's mood and manner are appropriate.   ED Results / Procedures / Treatments   LABS: (all labs ordered are listed, but only abnormal results are displayed) Labs Reviewed  COMPREHENSIVE METABOLIC PANEL - Abnormal; Notable for the following components:      Result Value   Glucose, Bld 170 (*)    Calcium 8.7 (*)    AST 12 (*)    Anion gap 4 (*)    All other components within normal limits  CBC - Abnormal; Notable for the following components:   Hemoglobin 11.5 (*)    HCT 35.4 (*)    All other components within normal limits  URINALYSIS, ROUTINE W REFLEX MICROSCOPIC - Abnormal; Notable for the following components:   Color, Urine YELLOW (*)    APPearance CLOUDY (*)    Leukocytes,Ua MODERATE (*)    Bacteria, UA FEW (*)    All other components within normal limits  AEROBIC CULTURE W GRAM STAIN (SUPERFICIAL SPECIMEN)  LIPASE, BLOOD  POC  URINE PREG, ED     EKG:   RADIOLOGY: My personal review and interpretation of imaging:    I have personally reviewed all radiology reports.   No results found.   PROCEDURES:  Critical Care performed: No      Procedures    IMPRESSION / MDM / ASSESSMENT AND PLAN / ED COURSE  I reviewed the triage vital signs and the nursing notes.    Patient here with omphalitis.  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     DIFFERENTIAL DIAGNOSIS (includes but not limited to):   Patient has omphalitis.  No  obvious sign of abscess.  Doubt intra-abdominal extension.  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  No signs of cellulitis.   Patient's presentation is most consistent with acute complicated illness / injury requiring diagnostic workup.   PLAN: Patient's labs unremarkable.  No leukocytosis, normal electrolytes, LFTs and lipase.  Abdominal exam benign other than some drainage and discomfort in the umbilicus.  Urine appears infected versus contaminated.  She has no urinary symptoms but we are going to start her on Keflex anyway which would cover potential UTI.  Will send wound culture.  No indication for emergent abdominal imaging.  Recommended Tylenol, Motrin over-the-counter.  Patient comfortable with this plan.   MEDICATIONS GIVEN IN ED: Medications  ibuprofen (ADVIL) tablet 800 mg (800 mg Oral Given 06/05/23 0016)  cephALEXin (KEFLEX) capsule 500 mg (500 mg Oral Given 06/05/23 0016)  HYDROcodone-acetaminophen (NORCO/VICODIN) 5-325 MG per tablet 1 tablet (1 tablet Oral Given 06/05/23 0016)     ED COURSE:  At this time, I do not feel there is any life-threatening condition present. I reviewed all nursing notes, vitals, pertinent previous records.  All lab and urine results, EKGs, imaging ordered have been independently reviewed and interpreted by myself.  I reviewed all available radiology reports from any imaging ordered this visit.  Based on my assessment, I feel the patient is safe to be discharged  home without further emergent workup and can continue workup as an outpatient as needed. Discussed all findings, treatment plan as well as usual and customary return precautions.  They verbalize understanding and are comfortable with this plan.  Outpatient follow-up has been provided as needed.  All questions have been answered.    CONSULTS:  none   OUTSIDE RECORDS REVIEWED: Reviewed previous cardiology note in December 2023.       FINAL CLINICAL IMPRESSION(S) / ED DIAGNOSES   Final diagnoses:  Omphalitis in adult     Rx / DC Orders   ED Discharge Orders          Ordered    cephALEXin (KEFLEX) 500 MG capsule  4 times daily        06/05/23 0008    ibuprofen (ADVIL) 800 MG tablet  Every 8 hours PRN        06/05/23 0008             Note:  This document was prepared using Dragon voice recognition software and may include unintentional dictation errors.   Balthazar Dooly, Layla Maw, DO 06/05/23 [phone]

## 2023-06-06 DIAGNOSIS — Z79899 Other long term (current) drug therapy: Secondary | ICD-10-CM | POA: Diagnosis not present

## 2023-06-06 DIAGNOSIS — R1030 Lower abdominal pain, unspecified: Secondary | ICD-10-CM | POA: Diagnosis not present

## 2023-06-06 DIAGNOSIS — Z792 Long term (current) use of antibiotics: Secondary | ICD-10-CM | POA: Diagnosis not present

## 2023-06-06 DIAGNOSIS — R739 Hyperglycemia, unspecified: Secondary | ICD-10-CM | POA: Diagnosis not present

## 2023-06-06 DIAGNOSIS — R109 Unspecified abdominal pain: Secondary | ICD-10-CM | POA: Diagnosis not present

## 2023-06-06 DIAGNOSIS — R10819 Abdominal tenderness, unspecified site: Secondary | ICD-10-CM | POA: Diagnosis not present

## 2023-06-06 DIAGNOSIS — R197 Diarrhea, unspecified: Secondary | ICD-10-CM | POA: Diagnosis not present

## 2023-06-07 LAB — AEROBIC CULTURE W GRAM STAIN (SUPERFICIAL SPECIMEN)

## 2023-06-16 ENCOUNTER — Ambulatory Visit (INDEPENDENT_AMBULATORY_CARE_PROVIDER_SITE_OTHER): Payer: Medicaid Other | Admitting: Clinical

## 2023-06-16 DIAGNOSIS — F4310 Post-traumatic stress disorder, unspecified: Secondary | ICD-10-CM | POA: Diagnosis not present

## 2023-06-16 DIAGNOSIS — F331 Major depressive disorder, recurrent, moderate: Secondary | ICD-10-CM

## 2023-06-16 DIAGNOSIS — F419 Anxiety disorder, unspecified: Secondary | ICD-10-CM

## 2023-06-16 DIAGNOSIS — F1210 Cannabis abuse, uncomplicated: Secondary | ICD-10-CM

## 2023-06-16 NOTE — Progress Notes (Signed)
Virtual Visit via Video Note   I connected with Chelsea Wade on 06/16/23 at  10:00 AM EDT by a video enabled telemedicine application and verified that I am speaking with the correct person using two identifiers.   Location: Patient: Home Provider: Office   I discussed the limitations of evaluation and management by telemedicine and the availability of in person appointments. The patient expressed understanding and agreed to proceed.   THERAPIST PROGRESS NOTE   Session Time: 10:00 AM-10:30 AM   Participation Level: Active   Behavioral Response: CasualAlertDepressed   Type of Therapy: Individual Therapy   Treatment Goals addressed: Mood and Coping   Interventions: CBT, Motivational Interviewing, Solution Focused and Strength-based   Summary: Chelsea Wade is a 22 y.o. female who presents with Depression. The OPT therapist worked with the patient for her ongoing OPT treatment. The OPT therapist utilized Motivational Interviewing to assist in creating therapeutic repore. The patient in the session was engaged and work in collaboration giving feedback about her triggers and symptoms over the past few weeks. The patient spoke about being happy she is on Christmas break and out of school and work for the next 2 weeks for holiday break.The patient spoke about upcoming transition in Feb 2025 of moving to another apartment within her complex that will have more living space. The OPT therapist worked with the patient on her inner mantra , self confidence, self esteem, and healthier patterns for long lasting health improvements. The OPT therapist worked with the patient on non-medication coping skills to manage her MH symptoms. The patient spoke about looking forward to spending time with family over the Christmas holidays.The Opt therapist overviewed upcoming appointments as listed in the patients MyChart.   Suicidal/Homicidal: Nowithout intent/plan   Therapist Response: The OPT therapist  worked with the patient for the patients scheduled session. The patient was engaged in her session and gave feedback in relation to triggers, symptoms, and behavior responses over the past few weeks. The OPT therapist worked with the patient utilizing an in session Cognitive Behavioral Therapy exercise. The patient was responsive in the session and verbalized, "I have finished my classes and we are out of work until school starts back on Jan 6th 2025 ".The patient spoke about her plans to spend time with family over the Christmas holidays.The OPT therapist worked with the patient on her basic needs areas including sleep, eating habits, exercise, and hygiene. The OPT therapist worked with the patient on management of stress/ anxiety with coping skills and using her support system.The OPT therapist worked with the patient on taking her upcoming transitions one step at time as she transitions back to work, back to school, and prepares to move all in the first 2 months of the new year. .The OPT therapist will continue treatment work with the patient in her next scheduled session.   Plan: Return again in 2/3 weeks.   Diagnosis:      Axis I: Recurrent moderate,major depression with Anxiety, PTSD, Cannabis Use Disorder                           Axis II: No diagnosis   Collaboration of Care: Overview of patient involvement with Dr. Tenny Craw in the med therapy program    Patient/Guardian was advised Release of Information must be obtained prior to any record release in order to collaborate their care with an outside provider. Patient/Guardian was advised if they have not already done so to contact  the registration department to sign all necessary forms in order for [place] to release information regarding their care.    Consent: Patient/Guardian gives verbal consent for treatment and assignment of benefits for services provided during this visit. Patient/Guardian expressed understanding and agreed to proceed.      I  discussed the assessment and treatment plan with the patient. The patient was provided an opportunity to ask questions and all were answered. The patient agreed with the plan and demonstrated an understanding of the instructions.   The patient was advised to call back or seek an in-person evaluation if the symptoms worsen or if the condition fails to improve as anticipated.   I provided 30 minutes of non-face-to-face time during this encounter.   Suzan Garibaldi, LCSW   06/16/2023

## 2023-06-25 DIAGNOSIS — Z419 Encounter for procedure for purposes other than remedying health state, unspecified: Secondary | ICD-10-CM | POA: Diagnosis not present

## 2023-06-27 ENCOUNTER — Telehealth (HOSPITAL_COMMUNITY): Payer: Medicaid Other | Admitting: Psychiatry

## 2023-06-27 ENCOUNTER — Encounter (HOSPITAL_COMMUNITY): Payer: Self-pay | Admitting: Psychiatry

## 2023-06-27 DIAGNOSIS — F411 Generalized anxiety disorder: Secondary | ICD-10-CM | POA: Diagnosis not present

## 2023-06-27 DIAGNOSIS — F902 Attention-deficit hyperactivity disorder, combined type: Secondary | ICD-10-CM

## 2023-06-27 DIAGNOSIS — F331 Major depressive disorder, recurrent, moderate: Secondary | ICD-10-CM | POA: Diagnosis not present

## 2023-06-27 MED ORDER — ARIPIPRAZOLE 5 MG PO TABS: 5.0000 mg | ORAL_TABLET | Freq: Every day | ORAL | 2 refills | Status: AC

## 2023-06-27 MED ORDER — CLONAZEPAM 1 MG PO TABS: 1.0000 mg | ORAL_TABLET | Freq: Every day | ORAL | 2 refills | Status: AC | PRN

## 2023-06-27 MED ORDER — ESCITALOPRAM OXALATE 20 MG PO TABS: 20.0000 mg | ORAL_TABLET | Freq: Every day | ORAL | 2 refills | Status: AC

## 2023-06-27 NOTE — Progress Notes (Signed)
 Virtual Visit via Video Note  I connected with Chelsea Wade on 06/27/23 at 10:20 AM EST by a video enabled telemedicine application and verified that I am speaking with the correct person using two identifiers.  Location: Patient: home Provider: office   I discussed the limitations of evaluation and management by telemedicine and the availability of in person appointments. The patient expressed understanding and agreed to proceed.      I discussed the assessment and treatment plan with the patient. The patient was provided an opportunity to ask questions and all were answered. The patient agreed with the plan and demonstrated an understanding of the instructions.   The patient was advised to call back or seek an in-person evaluation if the symptoms worsen or if the condition fails to improve as anticipated.  I provided 20 minutes of non-face-to-face time during this encounter.   Barnie Gull, MD  [facility] MD/PA/NP OP Progress Note  06/27/2023 10:43 AM Chelsea Wade  MRN:  [id number]  Chief Complaint:  Chief Complaint  Patient presents with   Depression   Anxiety   ADD   Follow-up   HPI: This patient is a 23 year old white female who lives with her probably friend in [place].  She is attending classes at [employer] for preeducation major.  She is also working as a geologist, engineering at a school.  The patient returns after about 6 months regarding her depression anxiety and troubles with focus.  She states that she has been more depressed and anxious lately.  Part of this was spending Christmas with her mother for the second year in her role.  Her mother died a year ago in the summer.  She does not think the Paxil  is really helping her anymore.  She has several family members was had a good response to Lexapro  and she would like to try it.  It looks like she tried it once before at a very low dosage as a teenager so I think this would be reasonable.  The Abilify  is  helping to some degree with the mood swings as well as the clonazepam  also helping with the anxiety.  She has not been using the Concerta  but still did fairly well in school except for math.  I suggested that we wait until she knows her schedule at as she was doing most of her studying in the evening and it would be too late to use a stimulant.  She is going to try to get a different job so she can have more time for her studies.  She denies any thoughts of self-harm or suicide Visit Diagnosis:    ICD-10-CM   1. Recurrent moderate major depressive disorder with anxiety (HCC)  F33.1    F41.9     2. Generalized anxiety disorder  F41.1     3. Attention deficit hyperactivity disorder (ADHD), combined type  F90.2       Past Psychiatric History: The patient has had 2 previous psychiatric hospitalizations and as well as medication trials of Prozac  Lexapro  Strattera and Focalin  XR   Past Medical History:  Past Medical History:  Diagnosis Date   ADHD (attention deficit hyperactivity disorder)    Allergic rhinitis    Anxiety disorder    Asthma    BMI 45.0-49.9, adult (HCC)    Convulsions/seizures (HCC) 11/10/2015   Depression    Drug overdose 11/10/2015   Intentional overdose of drug in tablet form (HCC) 11/10/2015   MDD (major depressive disorder), recurrent episode, severe (HCC)  11/12/2015   MDD (major depressive disorder), severe (HCC) 09/30/2017   Suicide attempt (HCC)    Tachycardia     Past Surgical History:  Procedure Laterality Date   TONSILLECTOMY      Family Psychiatric History: See below  Family History:  Family History  Problem Relation Age of Onset   Depression Mother    Bipolar disorder Father    Mental illness Father    Alcohol abuse Father    Schizophrenia Maternal Grandfather    Alcohol abuse Maternal Grandfather     Social History:  Social History   Socioeconomic History   Marital status: Single    Spouse name: Not on file   Number of children: Not on  file   Years of education: Not on file   Highest education level: Not on file  Occupational History   Not on file  Tobacco Use   Smoking status: Every Day    Current packs/day: 0.25    Types: Cigarettes   Smokeless tobacco: Never  Vaping Use   Vaping status: Every Day  Substance and Sexual Activity   Alcohol use: Yes    Alcohol/week: 3.0 standard drinks of alcohol    Types: 3 Shots of liquor per week    Comment: occ   Drug use: Yes    Frequency: 3.0 times per week    Types: Marijuana   Sexual activity: Not Currently    Birth control/protection: None  Other Topics Concern   Not on file  Social History Narrative   Lives with mom, step-dad, brother. Step brothers every other weekend.   Social Drivers of Corporate Investment Banker Strain: Not on file  Food Insecurity: Not on file  Transportation Needs: Not on file  Physical Activity: Not on file  Stress: Not on file  Social Connections: Not on file    Allergies: No Known Allergies  Metabolic Disorder Labs: Lab Results  Component Value Date   HGBA1C 5.2 10/02/2017   MPG 102.54 10/02/2017   MPG 108 11/14/2015   Lab Results  Component Value Date   PROLACTIN 57.7 (H) 10/02/2017   Lab Results  Component Value Date   CHOL 145 10/02/2017   TRIG 89 10/02/2017   HDL 39 (L) 10/02/2017   CHOLHDL 3.7 10/02/2017   VLDL 18 10/02/2017   LDLCALC 88 10/02/2017   LDLCALC 53 11/14/2015   Lab Results  Component Value Date   TSH 4.562 10/02/2017   TSH 3.926 11/14/2015    Therapeutic Level Labs: No results found for: LITHIUM No results found for: VALPROATE No results found for: CBMZ  Current Medications: Current Outpatient Medications  Medication Sig Dispense Refill   escitalopram  (LEXAPRO ) 20 MG tablet Take 1 tablet (20 mg total) by mouth daily. 30 tablet 2   albuterol  (PROVENTIL ) (2.5 MG/3ML) 0.083% nebulizer solution Take 3 mLs (2.5 mg total) by nebulization every 6 (six) hours as needed for wheezing or  shortness of breath. 75 mL 1   albuterol  (VENTOLIN  HFA) 108 (90 Base) MCG/ACT inhaler Inhale 1-2 puffs into the lungs every 6 (six) hours as needed for wheezing or shortness of breath. 1 each 0   ARIPiprazole  (ABILIFY ) 5 MG tablet Take 1 tablet (5 mg total) by mouth daily. 30 tablet 2   benzonatate  (TESSALON ) 100 MG capsule Take 1 capsule (100 mg total) by mouth every 8 (eight) hours as needed for cough. 21 capsule 0   clonazePAM  (KLONOPIN ) 1 MG tablet Take 1 tablet (1 mg total) by mouth daily as  needed for anxiety. 30 tablet 2   cyclobenzaprine (FLEXERIL) 10 MG tablet Take 5-10 mg by mouth 3 (three) times daily as needed for muscle spasms.     HYDROcodone  bit-homatropine (HYCODAN) 5-1.5 MG/5ML syrup Take 5 mLs by mouth every 8 (eight) hours as needed for cough. 50 mL 0   ibuprofen  (ADVIL ) 800 MG tablet Take 1 tablet (800 mg total) by mouth every 8 (eight) hours as needed. 30 tablet 0   INCASSIA 0.35 MG tablet Take 1 tablet by mouth daily.     ipratropium (ATROVENT ) 0.03 % nasal spray Place 2 sprays into both nostrils every 12 (twelve) hours. 30 mL 0   metoprolol succinate (TOPROL-XL) 25 MG 24 hr tablet Take 12.5 mg by mouth daily.     Nebulizers (COMPRESSOR/NEBULIZER) MISC 1 Units by Does not apply route as needed. 1 each 0   Vitamin D, Ergocalciferol, 50000 units CAPS Take 1 capsule by mouth once a week.     No current facility-administered medications for this visit.     Musculoskeletal: Strength & Muscle Tone: within normal limits Gait & Station: normal Patient leans: N/A  Psychiatric Specialty Exam: Review of Systems  Psychiatric/Behavioral:  Positive for dysphoric mood. The patient is nervous/anxious.   All other systems reviewed and are negative.   Last menstrual period 04/28/2023.There is no height or weight on file to calculate BMI.  General Appearance: Casual and Fairly Groomed  Eye Contact:  Good  Speech:  Clear and Coherent  Volume:  Normal  Mood:  Anxious and Dysphoric   Affect:  Congruent  Thought Process:  Goal Directed  Orientation:  Full (Time, Place, and Person)  Thought Content: Rumination   Suicidal Thoughts:  No  Homicidal Thoughts:  No  Memory:  Immediate;   Good Recent;   Good Remote;   Good  Judgement:  Good  Insight:  Good  Psychomotor Activity:  Decreased  Concentration:  Concentration: Fair and Attention Span: Fair  Recall:  Good  Fund of Knowledge: Good  Language: Good  Akathisia:  No  Handed:  Right  AIMS (if indicated): not done  Assets:  Communication Skills Desire for Improvement Physical Health Resilience Social Support Talents/Skills  ADL's:  Intact  Cognition: WNL  Sleep:  Good   Screenings: AIMS    Flowsheet Row [facility]  AIMS Total Score 0 0      AUDIT    Flowsheet Row [facility]) [facility] Ad[facility] ([facility]  Alcohol Use Disorder Identification Test Final Score (AUDIT) 6 0      GAD-7    Flowsheet Row Counselor from 09/06/2022 in [facility] [place] Counselor from 06/05/2021 in [facility] [place]  Total GAD-7 Score 16 20      PHQ2-9    Flowsheet Row Counselor from 09/06/2022 in [facility] [place] Video Visit from 08/27/2022 in [facility] at [place] Video Visit from 01/25/2022 in [facility] at [place] Video Visit from 11/06/2021 in [facility] at [place] Video Visit from 07/02/2021 in [facility] at [facility] Total Score 3 1 3  0 0  PHQ-9 Total Score 17 -- 8 -- --  Flowsheet Row ED from 06/04/2023 in [facility] Department at [facility] ED from 05/04/2023 in [facility] Department at [facility] ED from 01/30/2023 in [facility] at [facility] RISK CATEGORY No Risk No Risk No Risk        Assessment and Plan: This patient is a 23 year old female with a history of depression anxiety and possible ADD.  She was not able to keep up with taking the BuSpar  so we will discontinue it.  She does not feel like paroxetine  has helped her mood so we will switch to Lexapro  20 mg daily for depression and anxiety, continue Abilify  5 mg daily as a mood stabilizer and clonazepam  1 mg daily as needed for anxiety.  She will return to see me in 4 weeks  Collaboration of Care: Collaboration of Care: Referral or follow-up with counselor/therapist AEB patient will continue therapy with Jerel Pepper in our office  Patient/Guardian was advised Release of Information must be obtained prior to any record release in order to collaborate their care with an outside provider. Patient/Guardian was advised if they have not already done so to contact the registration department to sign all necessary forms in order for us  to release information regarding their care.   Consent: Patient/Guardian gives verbal consent for treatment and assignment of benefits for services provided during this visit. Patient/Guardian expressed understanding and agreed to proceed.    Barnie Gull, MD 06/27/2023, 10:43 AM

## 2023-07-06 DIAGNOSIS — Z419 Encounter for procedure for purposes other than remedying health state, unspecified: Secondary | ICD-10-CM | POA: Diagnosis not present

## 2023-07-22 ENCOUNTER — Ambulatory Visit (INDEPENDENT_AMBULATORY_CARE_PROVIDER_SITE_OTHER): Payer: Medicaid Other | Admitting: Clinical

## 2023-07-22 DIAGNOSIS — F4310 Post-traumatic stress disorder, unspecified: Secondary | ICD-10-CM | POA: Diagnosis not present

## 2023-07-22 DIAGNOSIS — F1210 Cannabis abuse, uncomplicated: Secondary | ICD-10-CM | POA: Diagnosis not present

## 2023-07-22 DIAGNOSIS — F902 Attention-deficit hyperactivity disorder, combined type: Secondary | ICD-10-CM | POA: Diagnosis not present

## 2023-07-22 DIAGNOSIS — F419 Anxiety disorder, unspecified: Secondary | ICD-10-CM | POA: Diagnosis not present

## 2023-07-22 DIAGNOSIS — F331 Major depressive disorder, recurrent, moderate: Secondary | ICD-10-CM

## 2023-07-22 NOTE — Progress Notes (Signed)
Virtual Visit via Video Note   I connected with Chelsea Wade on 07/22/23 at  3:00 PM EDT by a video enabled telemedicine application and verified that I am speaking with the correct person using two identifiers.   Location: Patient: Home Provider: Office   I discussed the limitations of evaluation and management by telemedicine and the availability of in person appointments. The patient expressed understanding and agreed to proceed.   THERAPIST PROGRESS NOTE   Session Time: 3:00 PM-3:30 PM   Participation Level: Active   Behavioral Response: CasualAlertDepressed   Type of Therapy: Individual Therapy   Treatment Goals addressed: Mood and Coping   Interventions: CBT, Motivational Interviewing, Solution Focused and Strength-based   Summary: Chelsea Wade is a 23 y.o. female who presents with Depression. The OPT therapist worked with the patient for her ongoing OPT treatment. The OPT therapist utilized Motivational Interviewing to assist in creating therapeutic repore. The patient in the session was engaged and work in collaboration giving feedback about her triggers and symptoms over the past few weeks through the holidays and into the new year through January. The patient spoke about getting into a conflict with her employer which led to her quiting. The patient spoke about realizing quitting her job and not having another job was not the smartest thing to do now looking back on the situation.The OPT therapist worked with the patient on her inner mantra , self confidence, self esteem, and healthier patterns for long lasting health improvements. The OPT therapist worked with the patient on non-medication coping skills to manage her MH symptoms. The patient spoke about currently looking for another job and noted she has been applying for a new job.The OPT therapist worked with the patient overviewing her med therapy and the patient noted that the pharmacy was out of the medication and she is  waiting for the pharmacy to get the medicine in and ready for pick which she was told would be ready in about a week.The patient noted one of the medications she is waiting on is Lexapro which she will be starting once the medication is available. The patient spoke about the impact of having better weather and putting more effort into her day with the warmer weather over the course of the week this has led to improved mood and improved hygiene. The Opt therapist overviewed upcoming appointments as listed in the patients MyChart.   Suicidal/Homicidal: Nowithout intent/plan   Therapist Response: The OPT therapist worked with the patient for the patients scheduled session. The patient was engaged in her session and gave feedback in relation to triggers, symptoms, and behavior responses over the past few weeks. The patient spoke about life changes including noting she will be moving to a new place with more space. The patient noted although she quit her job this has not created stress for the patient financially. The patient spoke about currently waiting for the pharmacy to call and let her know when her medication is ready for pick up.  The OPT therapist worked with the patient utilizing an in session Cognitive Behavioral Therapy exercise. The patient was responsive in the session and verbalized, "I feel like lately I just let the stress go and put things in gods hands and it just has been working out and we have been getting money unexpected that has helped cover bills".The OPT therapist worked with the patient on her basic needs areas including sleep, eating habits, exercise, and hygiene. The OPT therapist worked with the patient on management  of stress/ anxiety with coping skills and using her support system. The patient spoke about her Spring semester and having all online classes and preferring the online classes over traditional classes. The patient spoke about being excited to move to a place that will allow  her and partner to have more space.    Plan: Return again in 2/3 weeks.   Diagnosis:      Axis I: Recurrent moderate,major depression with Anxiety, PTSD, Cannabis Use Disorder                           Axis II: No diagnosis   Collaboration of Care: Overview of patient involvement with Dr. Tenny Craw in the med therapy program    Patient/Guardian was advised Release of Information must be obtained prior to any record release in order to collaborate their care with an outside provider. Patient/Guardian was advised if they have not already done so to contact the registration department to sign all necessary forms in order for [place] to release information regarding their care.    Consent: Patient/Guardian gives verbal consent for treatment and assignment of benefits for services provided during this visit. Patient/Guardian expressed understanding and agreed to proceed.      I discussed the assessment and treatment plan with the patient. The patient was provided an opportunity to ask questions and all were answered. The patient agreed with the plan and demonstrated an understanding of the instructions.   The patient was advised to call back or seek an in-person evaluation if the symptoms worsen or if the condition fails to improve as anticipated.   I provided 30 minutes of non-face-to-face time during this encounter.   Suzan Garibaldi, LCSW   07/22/2023

## 2023-07-26 DIAGNOSIS — Z419 Encounter for procedure for purposes other than remedying health state, unspecified: Secondary | ICD-10-CM | POA: Diagnosis not present

## 2023-08-06 DIAGNOSIS — Z419 Encounter for procedure for purposes other than remedying health state, unspecified: Secondary | ICD-10-CM | POA: Diagnosis not present

## 2023-08-12 ENCOUNTER — Ambulatory Visit (INDEPENDENT_AMBULATORY_CARE_PROVIDER_SITE_OTHER): Payer: Medicaid Other | Admitting: Clinical

## 2023-08-12 DIAGNOSIS — F331 Major depressive disorder, recurrent, moderate: Secondary | ICD-10-CM | POA: Diagnosis not present

## 2023-08-12 DIAGNOSIS — F419 Anxiety disorder, unspecified: Secondary | ICD-10-CM

## 2023-08-12 DIAGNOSIS — F1210 Cannabis abuse, uncomplicated: Secondary | ICD-10-CM

## 2023-08-12 NOTE — Progress Notes (Signed)
Virtual Visit via Video Note   I connected with Chelsea Wade on 08/12/23 at  2:00 PM EDT by a video enabled telemedicine application and verified that I am speaking with the correct person using two identifiers.   Location: Patient: Home Provider: Office   I discussed the limitations of evaluation and management by telemedicine and the availability of in person appointments. The patient expressed understanding and agreed to proceed.   THERAPIST PROGRESS NOTE   Session Time: 2:00 PM-2:30 PM   Participation Level: Active   Behavioral Response: CasualAlertDepressed   Type of Therapy: Individual Therapy   Treatment Goals addressed: Mood and Coping   Interventions: CBT, Motivational Interviewing, Solution Focused and Strength-based   Summary: Chelsea Wade is a 23 y.o. female who presents with Depression. The OPT therapist worked with the patient for her ongoing OPT treatment. The OPT therapist utilized Motivational Interviewing to assist in creating therapeutic repore. The patient in the session was engaged and work in collaboration giving feedback about her triggers and symptoms over the past few weeks. The patient spoke about recently relocating and moving into a new place since her session.  The patient noted she is currently enjoying and continuing to adjust to living in her new place. The OPT therapist worked with the patient on her inner mantra , self confidence, self esteem, and healthier patterns for long lasting health improvements. The OPT therapist worked with the patient on non-medication coping skills to manage her MH symptoms. The patient spoke about currently looking for a job.The OPT therapist worked with the patient overviewing her med therapy noting she has been able to pick up her medications but did not start taking her medication just last Friday and is still in her trail period with her current medication. The Opt therapist overviewed upcoming appointments as  listed in the patients MyChart.   Suicidal/Homicidal: Nowithout intent/plan   Therapist Response: The OPT therapist worked with the patient for the patients scheduled session. The patient was engaged in her session and gave feedback in relation to triggers, symptoms, and behavior responses over the past few weeks. The patient spoke about life changes including recently moving to a new place. The patient noted she was able to pick up her medication and has started her medication as of last Friday.  The OPT therapist worked with the patient utilizing an in session Cognitive Behavioral Therapy exercise. The patient was responsive in the session and verbalized, "I feel like better now we have more space than our old place".The OPT therapist worked with the patient on her basic needs areas including sleep, eating habits, exercise, and hygiene. The OPT therapist worked with the patient on management of stress/ anxiety with coping skills and using her support system. The patient spoke about her Spring semester and having all online classes and preferring the online classes over traditional classes.    Plan: Return again in 2/3 weeks.   Diagnosis:      Axis I: Recurrent moderate,major depression with Anxiety, PTSD, Cannabis Use Disorder                           Axis II: No diagnosis   Collaboration of Care: Overview of patient involvement with Dr. Tenny Craw in the med therapy program    Patient/Guardian was advised Release of Information must be obtained prior to any record release in order to collaborate their care with an outside provider. Patient/Guardian was advised if they have not  already done so to contact the registration department to sign all necessary forms in order for [place] to release information regarding their care.    Consent: Patient/Guardian gives verbal consent for treatment and assignment of benefits for services provided during this visit. Patient/Guardian expressed understanding and agreed to  proceed.      I discussed the assessment and treatment plan with the patient. The patient was provided an opportunity to ask questions and all were answered. The patient agreed with the plan and demonstrated an understanding of the instructions.   The patient was advised to call back or seek an in-person evaluation if the symptoms worsen or if the condition fails to improve as anticipated.   I provided 30 minutes of non-face-to-face time during this encounter.   Suzan Garibaldi, LCSW   08/12/2023

## 2023-08-23 DIAGNOSIS — Z419 Encounter for procedure for purposes other than remedying health state, unspecified: Secondary | ICD-10-CM | POA: Diagnosis not present

## 2023-09-03 DIAGNOSIS — Z419 Encounter for procedure for purposes other than remedying health state, unspecified: Secondary | ICD-10-CM | POA: Diagnosis not present

## 2023-09-09 ENCOUNTER — Ambulatory Visit (INDEPENDENT_AMBULATORY_CARE_PROVIDER_SITE_OTHER): Payer: Medicaid Other | Admitting: Clinical

## 2023-09-09 DIAGNOSIS — F419 Anxiety disorder, unspecified: Secondary | ICD-10-CM | POA: Diagnosis not present

## 2023-09-09 DIAGNOSIS — F1210 Cannabis abuse, uncomplicated: Secondary | ICD-10-CM

## 2023-09-09 DIAGNOSIS — F331 Major depressive disorder, recurrent, moderate: Secondary | ICD-10-CM

## 2023-09-09 NOTE — Progress Notes (Signed)
 Virtual Visit via Video Note   I connected with Chelsea Wade on 09/09/23 at  2:00 PM EDT by a video enabled telemedicine application and verified that I am speaking with the correct person using two identifiers.   Location: Patient: Home Provider: Office   I discussed the limitations of evaluation and management by telemedicine and the availability of in person appointments. The patient expressed understanding and agreed to proceed.   THERAPIST PROGRESS NOTE   Session Time: 2:00 PM-2:45 PM   Participation Level: Active   Behavioral Response: CasualAlertDepressed   Type of Therapy: Individual Therapy   Treatment Goals addressed: Mood and Coping   Interventions: CBT, Motivational Interviewing, Solution Focused and Strength-based   Summary: Chelsea Wade is a 23 y.o. female who presents with Depression. The OPT therapist worked with the patient for her ongoing OPT treatment. The OPT therapist utilized Motivational Interviewing to assist in creating therapeutic repore. The patient in the session was engaged and work in collaboration giving feedback about her triggers and symptoms over the past few weeks. The patient spoke about adjusting to relocating and moving into a new place and noted she has been doing much better with cleaning and keeping her class work up from her current courses.  The patient noted she is currently enjoying and continuing to adjust to living in her new place , but is still trying to figure out her area of interest, however, is looking towards a degree in Cosmetology. Additionally the patient is also currently interested in getting a business degree.The OPT therapist worked with the patient on her inner mantra , self confidence, self esteem, and healthier patterns for long lasting health improvements. The OPT therapist worked with the patient on non-medication coping skills to manage her MH symptoms.The OPT therapist worked with the patient overviewing her med therapy  noting she has not been consistent and lost her medication then refound her medication and started taking her medication just a week ago. The patient spoke about her goal of getting employment by Summer. The OPT therapist worked with the patient on management of her emotions and mood swings , however, also continued to place emphasis on consistency with her med therapy. The Opt therapist overviewed upcoming appointments as listed in the patients MyChart.   Suicidal/Homicidal: Nowithout intent/plan   Therapist Response: The OPT therapist worked with the patient for the patients scheduled session. The patient was engaged in her session and gave feedback in relation to triggers, symptoms, and behavior responses over the past few weeks. The patient spoke about life changes including recently moving to a new place. The patient noted she was able to pick up her medication and has started her medication as of last Friday.  The OPT therapist worked with the patient utilizing an in session Cognitive Behavioral Therapy exercise. The patient was responsive in the session and verbalized, "I feel like right now getting a job is not the right time , but that's not how my partner, and the bills, and the bank account feels about it". The patient spoke about feeling she has before self sabotaged by just taking jobs that were not good fits for her and moving forward she feels she will be more selective to ensure the employment is sustainable.The OPT therapist worked with the patient on her basic needs areas including sleep, eating habits, exercise, and hygiene. The OPT therapist worked with the patient on management of stress/ anxiety with coping skills and using her support system. The patient spoke about her Spring  semester  classes and having all online classes and preferring the online classes over traditional classes.    Plan: Return again in 2/3 weeks.   Diagnosis:      Axis I: Recurrent moderate,major depression with  Anxiety, PTSD, Cannabis Use Disorder                           Axis II: No diagnosis   Collaboration of Care: Overview of patient involvement with Dr. Tenny Craw in the med therapy program    Patient/Guardian was advised Release of Information must be obtained prior to any record release in order to collaborate their care with an outside provider. Patient/Guardian was advised if they have not already done so to contact the registration department to sign all necessary forms in order for [place] to release information regarding their care.    Consent: Patient/Guardian gives verbal consent for treatment and assignment of benefits for services provided during this visit. Patient/Guardian expressed understanding and agreed to proceed.      I discussed the assessment and treatment plan with the patient. The patient was provided an opportunity to ask questions and all were answered. The patient agreed with the plan and demonstrated an understanding of the instructions.   The patient was advised to call back or seek an in-person evaluation if the symptoms worsen or if the condition fails to improve as anticipated.   I provided 45 minutes of non-face-to-face time during this encounter.   Suzan Garibaldi, LCSW   09/09/2023

## 2023-09-18 DIAGNOSIS — Z20828 Contact with and (suspected) exposure to other viral communicable diseases: Secondary | ICD-10-CM | POA: Diagnosis not present

## 2023-09-18 DIAGNOSIS — R509 Fever, unspecified: Secondary | ICD-10-CM | POA: Diagnosis not present

## 2023-09-18 DIAGNOSIS — H5213 Myopia, bilateral: Secondary | ICD-10-CM | POA: Diagnosis not present

## 2023-09-18 DIAGNOSIS — R8290 Unspecified abnormal findings in urine: Secondary | ICD-10-CM | POA: Diagnosis not present

## 2023-09-18 DIAGNOSIS — Z13228 Encounter for screening for other metabolic disorders: Secondary | ICD-10-CM | POA: Diagnosis not present

## 2023-09-18 DIAGNOSIS — F419 Anxiety disorder, unspecified: Secondary | ICD-10-CM | POA: Diagnosis not present

## 2023-09-18 DIAGNOSIS — Z1159 Encounter for screening for other viral diseases: Secondary | ICD-10-CM | POA: Diagnosis not present

## 2023-09-18 DIAGNOSIS — J45991 Cough variant asthma: Secondary | ICD-10-CM | POA: Diagnosis not present

## 2023-09-18 DIAGNOSIS — Z1321 Encounter for screening for nutritional disorder: Secondary | ICD-10-CM | POA: Diagnosis not present

## 2023-09-18 DIAGNOSIS — R051 Acute cough: Secondary | ICD-10-CM | POA: Diagnosis not present

## 2023-09-18 DIAGNOSIS — R071 Chest pain on breathing: Secondary | ICD-10-CM | POA: Diagnosis not present

## 2023-09-18 DIAGNOSIS — I10 Essential (primary) hypertension: Secondary | ICD-10-CM | POA: Diagnosis not present

## 2023-09-23 DIAGNOSIS — R0789 Other chest pain: Secondary | ICD-10-CM | POA: Diagnosis not present

## 2023-09-24 ENCOUNTER — Emergency Department
Admission: EM | Admit: 2023-09-24 | Discharge: 2023-09-24 | Disposition: A | Discharge status: Home / Self Care | Attending: Emergency Medicine | Admitting: Emergency Medicine

## 2023-09-24 ENCOUNTER — Other Ambulatory Visit: Payer: Self-pay

## 2023-09-24 ENCOUNTER — Emergency Department

## 2023-09-24 DIAGNOSIS — J45909 Unspecified asthma, uncomplicated: Secondary | ICD-10-CM | POA: Insufficient documentation

## 2023-09-24 DIAGNOSIS — R0902 Hypoxemia: Secondary | ICD-10-CM | POA: Diagnosis not present

## 2023-09-24 DIAGNOSIS — R002 Palpitations: Secondary | ICD-10-CM | POA: Diagnosis not present

## 2023-09-24 DIAGNOSIS — R0789 Other chest pain: Secondary | ICD-10-CM | POA: Diagnosis not present

## 2023-09-24 DIAGNOSIS — R0600 Dyspnea, unspecified: Secondary | ICD-10-CM

## 2023-09-24 DIAGNOSIS — R079 Chest pain, unspecified: Secondary | ICD-10-CM | POA: Diagnosis not present

## 2023-09-24 DIAGNOSIS — R0602 Shortness of breath: Secondary | ICD-10-CM | POA: Diagnosis not present

## 2023-09-24 LAB — TROPONIN I (HIGH SENSITIVITY): Troponin I (High Sensitivity): <2 ng/L (ref <18)

## 2023-09-24 LAB — BASIC METABOLIC PANEL WITH GFR
Anion gap: 7 (ref 5–15)
BUN: 13 mg/dL (ref 6–20)
CO2: 23 mmol/L (ref 22–32)
Calcium: 8.6 mg/dL — ABNORMAL LOW (ref 8.9–10.3)
Chloride: 103 mmol/L (ref 98–111)
Creatinine, Ser: 0.6 mg/dL (ref 0.44–1.00)
GFR, Estimated: >60 mL/min (ref >60)
Glucose, Bld: 147 mg/dL — ABNORMAL HIGH (ref 70–99)
Potassium: 4.4 mmol/L (ref 3.5–5.1)
Sodium: 133 mmol/L — ABNORMAL LOW (ref 135–145)

## 2023-09-24 LAB — CBC
HCT: 36.8 % (ref 36.0–46.0)
Hemoglobin: 12.1 g/dL (ref 12.0–15.0)
MCH: 29.4 pg (ref 26.0–34.0)
MCHC: 32.9 g/dL (ref 30.0–36.0)
MCV: 89.5 fL (ref 80.0–100.0)
Platelets: 271 10*3/uL (ref 150–400)
RBC: 4.11 MIL/uL (ref 3.87–5.11)
RDW: 12.7 % (ref 11.5–15.5)
WBC: 7.5 10*3/uL (ref 4.0–10.5)
nRBC: 0 % (ref 0.0–0.2)

## 2023-09-24 LAB — HCG, QUANTITATIVE, PREGNANCY: hCG, Beta Chain, Quant, S: <1 m[IU]/mL (ref <5)

## 2023-09-24 MED ORDER — ACETAMINOPHEN 500 MG PO TABS
1000.0000 mg | ORAL_TABLET | Freq: Once | ORAL | Status: AC
  Administered 2023-09-24: 1000 mg via ORAL
  Filled 2023-09-24: qty 2

## 2023-09-24 NOTE — ED Provider Notes (Signed)
 Chelsea Wade Provider Note    Event Date/Time   First MD Initiated Contact with Patient 09/24/23 1059     (approximate)   History   Chest Pain   HPI  Chelsea Wade is a 23 y.o. female with history of MDD, asthma, ADHD, seizures, is on metoprolol for palpitations and tachycardia, presenting with chest tightness.  Also feels tight in her back.  Symptoms started on Sunday.  Has not taken any medications for the pain.  States that she will get intermittent palpitations and feel that short of breath.  No trauma or falls.  No infectious symptoms.  Has not had any recent infections or URIs.  She is on her period currently.  No unilateral calf swelling or tenderness, has not had any recent travel or surgeries, no prior history of blood clots.  She denies being on any hormones or OCPs.  Is pending cardiology follow-up.      Physical Exam   Triage Vital Signs: ED Triage Vitals  Encounter Vitals Group     BP 09/24/23 1002 119/73     Systolic BP Percentile --      Diastolic BP Percentile --      Pulse Rate 09/24/23 1002 98     Resp 09/24/23 1002 20     Temp 09/24/23 1004 98.6 F (37 C)     Temp Source 09/24/23 1004 Oral     SpO2 09/24/23 1002 97 %     Weight 09/24/23 1003 292 lb (132.5 kg)     Height 09/24/23 1003 5\' 4"  (1.626 m)     Head Circumference --      Peak Flow --      Pain Score 09/24/23 1000 5     Pain Loc --      Pain Education --      Exclude from Growth Chart --     Most recent vital signs: Vitals:   09/24/23 1002 09/24/23 1004  BP: 119/73   Pulse: 98   Resp: 20   Temp:  98.6 F (37 C)  SpO2: 97%      General: Awake, no distress.  CV:  Good peripheral perfusion.  Thoracic cage is nontender, no midline spinal tenderness Resp:  Normal effort.  Clear Abd:  No distention.  Soft nontender Other:  No lower extremity edema, no unilateral calf swelling or tenderness   ED Results / Procedures / Treatments   Labs (all labs ordered  are listed, but only abnormal results are displayed) Labs Reviewed  BASIC METABOLIC PANEL WITH GFR - Abnormal; Notable for the following components:      Result Value   Sodium 133 (*)    Glucose, Bld 147 (*)    Calcium 8.6 (*)    All other components within normal limits  CBC  HCG, QUANTITATIVE, PREGNANCY  TROPONIN I (HIGH SENSITIVITY)  TROPONIN I (HIGH SENSITIVITY)     EKG  Sinus rhythm with sinus arrhythmia, rate of 94, normal QRS, normal QTc, no ischemic ST elevation, T wave flattening in 3, T wave changes new compared to prior   RADIOLOGY Chest x-ray on my independent interpretation without focal consolidation.   PROCEDURES:  Critical Care performed: No  Procedures   MEDICATIONS ORDERED IN ED: Medications  acetaminophen (TYLENOL) tablet 1,000 mg (1,000 mg Oral Given 09/24/23 1114)     IMPRESSION / MDM / ASSESSMENT AND PLAN / ED COURSE  I reviewed the triage vital signs and the nursing notes.  Differential diagnosis includes, but is not limited to, ACS, anxiety, musculoskeletal pain, electrolyte derangements, arrhythmia, considered PE but patient is not hypoxic, not tachycardic, she is PERC 0.  Patient's presentation is most consistent with acute presentation with potential threat to life or bodily function.  Independent review of labs and imaging are below.  Shared decision making done with patient and she is agreeable with plan.  Will give him Tylenol for the aching.  Considered but no indication for inpatient admission at this time, she is safe for outpatient management.  Will discharge with strict return precautions.  Instructed her to follow-up with her cardiologist appointment.  Otherwise she can follow-up with her primary care doctor in 3 to 4 days for reassessment.  Clinical Course as of 09/24/23 1154  Wed Sep 24, 2023  1122 DG Chest 2 View Negative, no cardiopulmonary abnormality.  [TT]  1153 Independent review of labs,  troponin is negative, hCG is negative, electrolytes not severely deranged, creatinine is normal, electrolytes not severely deranged. [TT]    Clinical Course User Index [TT] Jodie Echevaria Franchot Erichsen, MD     FINAL CLINICAL IMPRESSION(S) / ED DIAGNOSES   Final diagnoses:  Chest pain, unspecified type  Palpitations  Dyspnea, unspecified type     Rx / DC Orders   ED Discharge Orders     None        Note:  This document was prepared using Dragon voice recognition software and may include unintentional dictation errors.    Claybon Jabs, MD 09/24/23 [phone]

## 2023-09-24 NOTE — ED Triage Notes (Signed)
 To ED from home [facility] for R sided reproducible chest pain since Sunday, substernal with radiation to back. Has tried prescribed muscle relaxers, not working.   151/87, ST on ems EKG with rate 112-120, 98% RA. Hx tachycardia, anxiety.   Skin dry, no SOB noted.

## 2023-09-24 NOTE — ED Notes (Signed)
 Pt d/c home per EDP order. Discharge summary reviewed, pt verbalizes understanding. Ambulatory off unit. NAD.

## 2023-09-24 NOTE — Discharge Instructions (Signed)
 Please make sure to follow-up with your cardiologist for further management of your symptoms.

## 2023-10-04 DIAGNOSIS — Z419 Encounter for procedure for purposes other than remedying health state, unspecified: Secondary | ICD-10-CM | POA: Diagnosis not present

## 2023-10-08 ENCOUNTER — Other Ambulatory Visit: Payer: Self-pay

## 2023-10-08 DIAGNOSIS — R079 Chest pain, unspecified: Secondary | ICD-10-CM | POA: Insufficient documentation

## 2023-10-08 DIAGNOSIS — R071 Chest pain on breathing: Secondary | ICD-10-CM | POA: Insufficient documentation

## 2023-10-08 DIAGNOSIS — I10 Essential (primary) hypertension: Secondary | ICD-10-CM | POA: Insufficient documentation

## 2023-10-14 ENCOUNTER — Ambulatory Visit (INDEPENDENT_AMBULATORY_CARE_PROVIDER_SITE_OTHER): Admitting: Clinical

## 2023-10-14 DIAGNOSIS — F331 Major depressive disorder, recurrent, moderate: Secondary | ICD-10-CM

## 2023-10-14 DIAGNOSIS — F1210 Cannabis abuse, uncomplicated: Secondary | ICD-10-CM

## 2023-10-14 DIAGNOSIS — F4310 Post-traumatic stress disorder, unspecified: Secondary | ICD-10-CM

## 2023-10-14 DIAGNOSIS — F419 Anxiety disorder, unspecified: Secondary | ICD-10-CM

## 2023-10-14 NOTE — Progress Notes (Signed)
 Virtual Visit via Video Note   I connected with Chelsea Wade on 10/14/23 at  2:00 PM EDT by a video enabled telemedicine application and verified that I am speaking with the correct person using two identifiers.   Location: Patient: Home Provider: Office   I discussed the limitations of evaluation and management by telemedicine and the availability of in person appointments. The patient expressed understanding and agreed to proceed.   THERAPIST PROGRESS NOTE   Session Time: 2:00 PM-2:30 PM   Participation Level: Active   Behavioral Response: CasualAlertDepressed   Type of Therapy: Individual Therapy   Treatment Goals addressed: Mood and Coping   Interventions: CBT, Motivational Interviewing, Solution Focused and Strength-based   Summary: Chelsea Wade is a 23 y.o. female who presents with Depression. The OPT therapist worked with the patient for her ongoing OPT treatment. The OPT therapist utilized Motivational Interviewing to assist in creating therapeutic repore. The patient in the session was engaged and work in collaboration giving feedback about her triggers and symptoms over the past few weeks. The patient spoke about the benefits from her med management and Lexapro  over the past few weeks including improvement with mood , functioning, and interactions. Additionally the patient spoke about getting a new puppy over the previous Easter weekend.The OPT therapist worked with the patient on her inner mantra , self confidence, self esteem, and healthier patterns for long lasting health improvements. The OPT therapist worked with the patient on non-medication coping skills to manage her MH symptoms.The OPT therapist worked with the patient overviewing ongoing adjustment in living in a new place and looking work. The patient spoke about spending time with family and doing a cookout over the recent Easter holiday weekend. The patient spoke about her ongoing goal of organizing at the new home  The OPT therapist worked with the patient on management of her emotions and mood swings.  The patient spoke about having bad chest pain not long after her last OPT visit and going to the ED for review. The patient spoke about prior to going to the ED she had did a EKG at Urgent Care. The patient spoke about going to the gym since the ED visit and making diet changes and trying to live healthier including working on smoking cessation and these changes her chest pain has gone away. The OPT therapist overviewed upcoming appointments as listed in the patients MyChart including upcoming Cardiology appointment later this week.   Suicidal/Homicidal: Nowithout intent/plan   Therapist Response: The OPT therapist worked with the patient for the patients scheduled session. The patient was engaged in her session and gave feedback in relation to triggers, symptoms, and behavior responses over the past few weeks. The patient spoke about ongoing adjustments to recently moving to a new place. The patient noted she has realized post ED visit she has to make changes and take her health more serious and she has been working on exercising more, diet changes, and working on smoking cessation.The patient spoke about the noticeable changes she has had just from exercising more and diet changes.  The OPT therapist worked with the patient utilizing an in session Cognitive Behavioral Therapy exercise. The patient was responsive in the session and verbalized, "I really thought changes with diet and exercise helping you feel better was bull , but it really does help".The OPT therapist worked with the patient on her basic needs areas including sleep, eating habits, exercise, and hygiene. The OPT therapist worked with the patient on management of stress/  anxiety with coping skills and using her support system. The patient spoke about looking forward to the pool opening and incorporating this into her exercise routine.   Plan: Return again  in 2/3 weeks.   Diagnosis:      Axis I: Recurrent moderate,major depression with Anxiety, PTSD, Cannabis Use Disorder                           Axis II: No diagnosis   Collaboration of Care: Overview of patient involvement with Dr. Avanell Bob in the med therapy program    Patient/Guardian was advised Release of Information must be obtained prior to any record release in order to collaborate their care with an outside provider. Patient/Guardian was advised if they have not already done so to contact the registration department to sign all necessary forms in order for us  to release information regarding their care.    Consent: Patient/Guardian gives verbal consent for treatment and assignment of benefits for services provided during this visit. Patient/Guardian expressed understanding and agreed to proceed.      I discussed the assessment and treatment plan with the patient. The patient was provided an opportunity to ask questions and all were answered. The patient agreed with the plan and demonstrated an understanding of the instructions.   The patient was advised to call back or seek an in-person evaluation if the symptoms worsen or if the condition fails to improve as anticipated.   I provided 30 minutes of non-face-to-face time during this encounter.   Secundino Dach, LCSW   10/14/2023

## 2023-10-16 ENCOUNTER — Ambulatory Visit

## 2023-10-28 ENCOUNTER — Ambulatory Visit

## 2023-10-28 VITALS — BP 120/82 | HR 98 | Ht 64.0 in | Wt 293.6 lb

## 2023-10-28 DIAGNOSIS — F1290 Cannabis use, unspecified, uncomplicated: Secondary | ICD-10-CM

## 2023-10-28 DIAGNOSIS — R002 Palpitations: Secondary | ICD-10-CM | POA: Diagnosis not present

## 2023-10-28 DIAGNOSIS — R079 Chest pain, unspecified: Secondary | ICD-10-CM

## 2023-10-28 DIAGNOSIS — Z720 Tobacco use: Secondary | ICD-10-CM | POA: Diagnosis not present

## 2023-10-28 NOTE — Assessment & Plan Note (Signed)
 Appears atypical and likely noncardiac. She is exercising regularly up to 30 minutes on a treadmill without any significant symptoms.  This is reassuring.  At this time we will hold off on any additional workup for cardiac causes of chest pain.

## 2023-10-28 NOTE — Assessment & Plan Note (Addendum)
 Intermittent episodes as reviewed below.  No aggravating or relieving factors.  No sustained episodes. Relatively elevated heart rate at baseline. No significant thyroid  abnormalities  Will obtain a 14-day Zio patch monitor. Further follow-up return to clinic based on test results.  Was empirically started on metoprolol succinate 12.5 mg once daily by PCP recently for elevated blood pressures and heart rate.  Continue this for now as she seems that this has helped and she remains asymptomatic and tolerating it well.

## 2023-10-28 NOTE — Patient Instructions (Signed)
 Medication Instructions:  Your physician recommends that you continue on your current medications as directed. Please refer to the Current Medication list given to you today.  *If you need a refill on your cardiac medications before your next appointment, please call your pharmacy*   Lab Work: None Ordered If you have labs (blood work) drawn today and your tests are completely normal, you will receive your results only by: MyChart Message (if you have MyChart) OR A paper copy in the mail If you have any lab test that is abnormal or we need to change your treatment, we will call you to review the results.   Testing/Procedures: A zio monitor was ordered today. It will remain on for 14 days. Remove 11/11/2023. You will then return monitor and event diary in provided box. It takes 1-2 weeks for report to be downloaded and returned to us . We will call you with the results. If monitor falls off or has orange flashing light, please call Zio for further instructions.     Follow-Up: At [facility], you and your health needs are our priority.  As part of our continuing mission to provide you with exceptional heart care, we have created designated Provider Care Teams.  These Care Teams include your primary Cardiologist (physician) and Advanced Practice Providers (APPs -  Physician Assistants and Nurse Practitioners) who all work together to provide you with the care you need, when you need it.  We recommend signing up for the patient portal called "MyChart".  Sign up information is provided on this After Visit Summary.  MyChart is used to connect with patients for Virtual Visits (Telemedicine).  Patients are able to view lab/test results, encounter notes, upcoming appointments, etc.  Non-urgent messages can be sent to your provider as well.   To learn more about what you can do with MyChart, go to [email or web address].    Your next appointment:   Follow up based on test results

## 2023-10-28 NOTE — Assessment & Plan Note (Signed)
 Mentions has been smoking marijuana to help with anxiety and stress. Advise given her history of asthma to avoid smoking forms of marijuana.

## 2023-10-28 NOTE — Progress Notes (Addendum)
 Cardiology Consultation:    Date:  10/28/2023   ID:  Chelsea Wade, DOB February 25, 2001, MRN [id number]  PCP:  Nestor Elston NOVAK, NP  Cardiologist:  Alean JONELLE Kobus, MD   Referring MD: Nestor Elston NOVAK, NP   No chief complaint on file.    ASSESSMENT AND PLAN:   Chelsea Wade 23 year old young woman with history of hypertension, anxiety, depression, ADHD [currently not on any medications], obesity, asthma, nicotine  vape use and smokes marijuana Problem List Items Addressed This Visit     Chest pain   Appears atypical and likely noncardiac. She is exercising regularly up to 30 minutes on a treadmill without any significant symptoms.  This is reassuring.  At this time we will hold off on any additional workup for cardiac causes of chest pain.      Relevant Orders   EKG 12-Lead (Completed)   LONG TERM MONITOR (3-14 DAYS)   Palpitations - Primary   Intermittent episodes as reviewed below.  No aggravating or relieving factors.  No sustained episodes. Relatively elevated heart rate at baseline. No significant thyroid  abnormalities  Will obtain a 14-day Zio patch monitor. Further follow-up return to clinic based on test results.  Was empirically started on metoprolol succinate 12.5 mg once daily by PCP recently for elevated blood pressures and heart rate.  Continue this for now as she seems that this has helped and she remains asymptomatic and tolerating it well.      Relevant Orders   EKG 12-Lead (Completed)   LONG TERM MONITOR (3-14 DAYS)   Vapes nicotine  containing substance   Advised about harmful effects and strongly recommended to quit.      Marijuana use   Mentions has been smoking marijuana to help with anxiety and stress. Advise given her history of asthma to avoid smoking forms of marijuana.       Return to clinic based on test results.  Addendum 04/12/2024: Predominantly sinus rhythm with average heart rate 101/min [ranging from 49 to 194 bpm]. Rare  ventricular and supraventricular ectopy burden less than 1%. Type I second-degree AV block intermittently.  No high-grade AV blocks, pauses.  Patient triggered events total 24, correlated with normal rhythm ranging from 84 bpm to 184 bpm and isolated ventricular ectopy Reviewed with her today in person while at office visiting with her grandmother. She mentions low-dose metoprolol succinate 12.5 mg once daily that she has been taking has improved symptoms once she started taking it consistently every day. Mentions Apple smart watch heart rates are typically well-controlled at home recently. The dose of metoprolol succinate can be further titrated up to 25 mg once daily if her resting heart rate is elevated above 90 bpm.  History of Present Illness:    Chelsea Wade is a 23 y.o. female who is being seen today for the evaluation of chest pain and palpitations.  At the request of Tetter, Devin B, NP.  Pleasant woman here for the visit accompanied by her maternal grandmother.  Has history of hypertension, anxiety, depression, ADHD, obesity, asthma, uses nicotine  vape and smokes marijuana. No significant prior cardiac history.  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  Mentions for the past few months she has been noticing symptoms of palpitations that she describes as a sensation of fast heartbeat that can last for few seconds to minutes and at times with a sense of extra beat or a skipped heartbeat.  At times this is associated with chest discomfort.  No syncopal or near syncopal episodes related to these.  She does have Apple smart watch which she is able to record heart rate but is not set up to take electrograms.  She also describes atypical chest pain in upper right part of the chest and at times left part of the chest that occurs unrelated to her palpitations. Denies any associated shortness of breath.  She has been exercising 3-4 times a  week and goes on a treadmill for about 30 minutes or more with heart rates elevating up to 180 bpm and she denies any symptoms of chest pain or significant shortness of breath with these episodes and other than effort related shortness of breath no other significant symptoms.  Recently at PCPs office visit heart rates were somewhat elevated and blood pressure was elevated 132/98 mmHg and was started on metoprolol succinate 12.5 mg once daily has been tolerating this well.  She has lost 7 pounds over the past month with exercise and dietary modification.  She does have asthma for which she uses inhalers as needed.  Reportedly has ADHD but off medications for many months as it did not help her with symptoms.  She does use nicotine  vape.  Never smoked tobacco. Uses marijuana, in the form of smoking. Does not drink alcohol.  No other illicit drug use.  Family history: She is estranged from her father does not know much about family history on that side. Mother passed away in her 51s due to complications from Crohn's disease. Grandfather on father side has history of CAD.   EKG in the clinic today shows sinus rhythm heart rate 98/min, PR interval 160 ms, QRS duration 86 ms, QTc normal 451 ms.  Appearance of anteroseptal Q waves.  No significant change in comparison to prior EKG from 09/24/2023.  She reportedly had symptoms of chest tightness and palpitations for which she had an ER visit on September 24, 2023 at [facility].  Hemodynamically was stable with normal blood pressure.  Workup in the ER high-sensitivity troponin was less than 2, pregnancy test was negative, CBC was unremarkable, BUN 13, creatinine 0.6.   Blood work from 09-18-2023 sodium 138, potassium 4.4 BUN 11, creatinine 0.68, EGFR 126 AST 10, ALT 14, alkaline phosphatase 132. Lipid panel total cholesterol 148, triglycerides 120, LDL 86, HDL 38. TSH normal 2.46, no CBC with hemoglobin 13.2, hematocrit 41.4, WBC  9.6 and platelets 324.    Past Medical History:  Diagnosis Date   ADHD (attention deficit hyperactivity disorder)    Allergic rhinitis    Anxiety disorder    Asthma    BMI 45.0-49.9, adult (HCC)    Chest pain    Chest pain on breathing    Convulsions/seizures (HCC) 11/10/2015   Depression    Drug overdose 11/10/2015   Essential (primary) hypertension    Intentional overdose of drug in tablet form (HCC) 11/10/2015   MDD (major depressive disorder), recurrent episode, severe (HCC) 11/12/2015   MDD (major depressive disorder), severe (HCC) 09/30/2017   Suicide attempt (HCC)    Tachycardia     Past Surgical History:  Procedure Laterality Date   TONSILLECTOMY      Current Medications: Current Meds  Medication Sig   albuterol  (PROVENTIL ) (2.5 MG/3ML) 0.083% nebulizer solution Take 3 mLs (2.5 mg total) by nebulization every 6 (six) hours as needed for wheezing or shortness of breath.   albuterol  (VENTOLIN  HFA) 108 (90 Base) MCG/ACT inhaler Inhale 1-2 puffs into the lungs every 6 (six) hours  as needed for wheezing or shortness of breath.   ARIPiprazole  (ABILIFY ) 5 MG tablet Take 1 tablet (5 mg total) by mouth daily.   benzonatate  (TESSALON ) 100 MG capsule Take 1 capsule (100 mg total) by mouth every 8 (eight) hours as needed for cough.   busPIRone  (BUSPAR ) 5 MG tablet Take 5 mg by mouth 3 (three) times daily.   clonazePAM  (KLONOPIN ) 1 MG tablet Take 1 tablet (1 mg total) by mouth daily as needed for anxiety.   cyclobenzaprine (FLEXERIL) 10 MG tablet Take 5-10 mg by mouth 3 (three) times daily as needed for muscle spasms.   doxycycline  (VIBRA -TABS) 100 MG tablet Take 100 mg by mouth daily.   escitalopram  (LEXAPRO ) 20 MG tablet Take 1 tablet (20 mg total) by mouth daily.   HYDROcodone  bit-homatropine (HYCODAN) 5-1.5 MG/5ML syrup Take 5 mLs by mouth every 8 (eight) hours as needed for cough.   ibuprofen  (ADVIL ) 800 MG tablet Take 1 tablet (800 mg total) by mouth every 8 (eight) hours  as needed.   ipratropium (ATROVENT ) 0.03 % nasal spray Place 2 sprays into both nostrils every 12 (twelve) hours.   metoprolol succinate (TOPROL-XL) 25 MG 24 hr tablet Take 12.5 mg by mouth daily.   Nebulizers (COMPRESSOR/NEBULIZER) MISC 1 Units by Does not apply route as needed.   Vitamin D, Ergocalciferol, 50000 units CAPS Take 1 capsule by mouth once a week.     Allergies:   Patient has no known allergies.   Social History   Socioeconomic History   Marital status: Single    Spouse name: Not on file   Number of children: Not on file   Years of education: Not on file   Highest education level: Not on file  Occupational History   Not on file  Tobacco Use   Smoking status: Former    Current packs/day: 0.50    Types: Cigarettes   Smokeless tobacco: Never  Vaping Use   Vaping status: Every Day  Substance and Sexual Activity   Alcohol use: Yes    Alcohol/week: 3.0 standard drinks of alcohol    Types: 3 Shots of liquor per week    Comment: occ   Drug use: Yes    Frequency: 3.0 times per week    Types: Marijuana   Sexual activity: Not Currently    Birth control/protection: None  Other Topics Concern   Not on file  Social History Narrative   Lives with mom, step-dad, brother. Step brothers every other weekend.   Social Drivers of Corporate investment banker Strain: Not on file  Food Insecurity: Not on file  Transportation Needs: Not on file  Physical Activity: Not on file  Stress: Not on file  Social Connections: Not on file     Family History: The patient's family history includes Alcohol abuse in her father and maternal grandfather; Bipolar disorder in her father; Depression in her mother; Mental illness in her father; Schizophrenia in her maternal grandfather. ROS:   Please see the history of present illness.    All 14 point review of systems negative except as described per history of present illness.  EKGs/Labs/Other Studies Reviewed:    The following studies  were reviewed today:   EKG:  EKG Interpretation Date/Time:  Tuesday Oct 28 2023 09:47:27 EDT Ventricular Rate:  98 PR Interval:  160 QRS Duration:  86 QT Interval:  354 QTC Calculation: 451 R Axis:   72  Text Interpretation: Normal sinus rhythm Cannot rule out Anterior infarct (cited  on or before 24-Sep-2023) When compared with ECG of 24-Sep-2023 10:02, No significant change was found Confirmed by Liborio Hai reddy [phone]) on 10/28/2023 10:17:31 AM    Recent Labs: 06/04/2023: ALT 14 09/24/2023: BUN 13; Creatinine, Ser 0.60; Hemoglobin 12.1; Platelets 271; Potassium 4.4; Sodium 133  Recent Lipid Panel    Component Value Date/Time   CHOL 145 10/02/2017 0653   TRIG 89 10/02/2017 0653   HDL 39 (L) 10/02/2017 0653   CHOLHDL 3.7 10/02/2017 0653   VLDL 18 10/02/2017 0653   LDLCALC 88 10/02/2017 0653    Physical Exam:    VS:  BP 120/82   Pulse 98   Ht 5' 4 (1.626 m)   Wt 293 lb 9.6 oz (133.2 kg)   SpO2 99%   BMI 50.40 kg/m     Wt Readings from Last 3 Encounters:  10/28/23 293 lb 9.6 oz (133.2 kg)  09/24/23 292 lb (132.5 kg)  06/04/23 290 lb (131.5 kg)     GENERAL:  Well nourished, well developed in no acute distress NECK: No JVD; No carotid bruits CARDIAC: RRR, S1 and S2 present, no murmurs, no rubs, no gallops CHEST:  Clear to auscultation without rales, wheezing or rhonchi  Extremities: No pitting pedal edema. Pulses bilaterally symmetric with radial 2+ and dorsalis pedis 2+ NEUROLOGIC:  Alert and oriented x 3  Medication Adjustments/Labs and Tests Ordered: Current medicines are reviewed at length with the patient today.  Concerns regarding medicines are outlined above.  Orders Placed This Encounter  Procedures   LONG TERM MONITOR (3-14 DAYS)   EKG 12-Lead   No orders of the defined types were placed in this encounter.   Signed, Hai jess Liborio, MD, MPH, [facility]. 10/28/2023 10:41 AM    [facility]

## 2023-10-28 NOTE — Assessment & Plan Note (Signed)
 Advised about harmful effects and strongly recommended to quit.

## 2023-11-03 DIAGNOSIS — Z419 Encounter for procedure for purposes other than remedying health state, unspecified: Secondary | ICD-10-CM | POA: Diagnosis not present

## 2023-11-03 DIAGNOSIS — J45991 Cough variant asthma: Secondary | ICD-10-CM | POA: Diagnosis not present

## 2023-11-03 DIAGNOSIS — F32A Depression, unspecified: Secondary | ICD-10-CM | POA: Diagnosis not present

## 2023-11-03 DIAGNOSIS — Z6841 Body Mass Index (BMI) 40.0 and over, adult: Secondary | ICD-10-CM | POA: Diagnosis not present

## 2023-11-03 DIAGNOSIS — Z01419 Encounter for gynecological examination (general) (routine) without abnormal findings: Secondary | ICD-10-CM | POA: Diagnosis not present

## 2023-11-03 DIAGNOSIS — R002 Palpitations: Secondary | ICD-10-CM | POA: Diagnosis not present

## 2023-11-03 DIAGNOSIS — Z3202 Encounter for pregnancy test, result negative: Secondary | ICD-10-CM | POA: Diagnosis not present

## 2023-11-03 DIAGNOSIS — Z713 Dietary counseling and surveillance: Secondary | ICD-10-CM | POA: Diagnosis not present

## 2023-11-03 DIAGNOSIS — Z1321 Encounter for screening for nutritional disorder: Secondary | ICD-10-CM | POA: Diagnosis not present

## 2023-11-03 DIAGNOSIS — F419 Anxiety disorder, unspecified: Secondary | ICD-10-CM | POA: Diagnosis not present

## 2023-11-03 DIAGNOSIS — E282 Polycystic ovarian syndrome: Secondary | ICD-10-CM | POA: Diagnosis not present

## 2023-11-03 DIAGNOSIS — Z0000 Encounter for general adult medical examination without abnormal findings: Secondary | ICD-10-CM | POA: Diagnosis not present

## 2023-11-03 DIAGNOSIS — L732 Hidradenitis suppurativa: Secondary | ICD-10-CM | POA: Diagnosis not present

## 2023-11-19 ENCOUNTER — Ambulatory Visit (HOSPITAL_COMMUNITY): Admitting: Clinical

## 2023-11-19 DIAGNOSIS — F331 Major depressive disorder, recurrent, moderate: Secondary | ICD-10-CM

## 2023-11-19 DIAGNOSIS — F4310 Post-traumatic stress disorder, unspecified: Secondary | ICD-10-CM

## 2023-11-19 DIAGNOSIS — F1210 Cannabis abuse, uncomplicated: Secondary | ICD-10-CM

## 2023-11-19 DIAGNOSIS — F1290 Cannabis use, unspecified, uncomplicated: Secondary | ICD-10-CM

## 2023-11-19 DIAGNOSIS — F419 Anxiety disorder, unspecified: Secondary | ICD-10-CM

## 2023-11-19 NOTE — Progress Notes (Signed)
 Virtual Visit via Video Note   I connected with Chelsea Wade on 11/19/23 at  1:00 PM EDT by a video enabled telemedicine application and verified that I am speaking with the correct person using two identifiers.   Location: Patient: Home Provider: Office   I discussed the limitations of evaluation and management by telemedicine and the availability of in person appointments. The patient expressed understanding and agreed to proceed.   THERAPIST PROGRESS NOTE   Session Time: 1:00 PM-1:42 PM   Participation Level: Active   Behavioral Response: CasualAlertDepressed   Type of Therapy: Individual Therapy   Treatment Goals addressed: Mood and Coping   Interventions: CBT, Motivational Interviewing, Solution Focused and Strength-based   Summary: Chelsea Wade is a 23 y.o. female who presents with Depression. The OPT therapist worked with the patient for her ongoing OPT treatment. The OPT therapist utilized Motivational Interviewing to assist in creating therapeutic repore. The patient in the session was engaged and work in collaboration giving feedback about her triggers and symptoms over the past few weeks. The patient spoke about losing a pet recent and this being very difficult for her. Additionally the patient spoke about having some interfamily conflict during around the same time the patients pet passed away. The patient spoke the impact of the weather recently with lots of rain. 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  Additionally the patient spoke about upcoming changes including taking on more work hours. The patient spoke about recently  her college classes starting back up for the Summer.The OPT therapist worked with the patient on time management including utilizing a planner. Additionally the OPT therapist worked with the patient on having some flexibility built into her routine. The OPT therapist overviewed upcoming appointments as  listed in the patients MyChart.   Suicidal/Homicidal: Nowithout intent/plan   Therapist Response: The OPT therapist worked with the patient for the patients scheduled session. The patient was engaged in her session and gave feedback in relation to triggers, symptoms, and behavior responses over the past few weeks. The patient spoke about having a lot of changes both positive and negative including losing a pet, conflict with a family member, getting a job and starting her college classes back.The patient spoke about  recent Cardiology appointment and wearing a heart monitor and recently turning it in an waiting to hear back on the results. The patient spoke about recent approval for weight loss medicine next month. The OPT therapist worked with the patient utilizing an in session Cognitive Behavioral Therapy exercise. The patient was responsive in the session and verbalized, "I am excited about starting my weight loss medication and getting back into the gym".The OPT therapist worked with the patient on her basic needs areas including sleep, eating habits, exercise, and hygiene. The OPT therapist worked with the patient on management of stress/ anxiety with coping skills and using her support system. The patient spoke about looking forward to the pool opening and incorporating this into her exercise routine. The OPT therapist encouraged ongoing work on smoking cessation for health benefits.The OPT therapist worked with the patient on time management and utilizing a planner.    Plan: Return again in 2/3 weeks.   Diagnosis:      Axis I: Recurrent moderate,major depression with Anxiety, PTSD, Cannabis Use Disorder                           Axis II:  No diagnosis   Collaboration of Care: Overview of patient involvement with Dr. Avanell Bob in the med therapy program    Patient/Guardian was advised Release of Information must be obtained prior to any record release in order to collaborate their care with an outside  provider. Patient/Guardian was advised if they have not already done so to contact the registration department to sign all necessary forms in order for us  to release information regarding their care.    Consent: Patient/Guardian gives verbal consent for treatment and assignment of benefits for services provided during this visit. Patient/Guardian expressed understanding and agreed to proceed.      I discussed the assessment and treatment plan with the patient. The patient was provided an opportunity to ask questions and all were answered. The patient agreed with the plan and demonstrated an understanding of the instructions.   The patient was advised to call back or seek an in-person evaluation if the symptoms worsen or if the condition fails to improve as anticipated.   I provided 42 minutes of non-face-to-face time during this encounter.   Secundino Dach, LCSW   11/19/2023

## 2023-12-04 DIAGNOSIS — Z419 Encounter for procedure for purposes other than remedying health state, unspecified: Secondary | ICD-10-CM | POA: Diagnosis not present

## 2023-12-05 DIAGNOSIS — L0882 Omphalitis not of newborn: Secondary | ICD-10-CM | POA: Diagnosis not present

## 2023-12-09 DIAGNOSIS — Z111 Encounter for screening for respiratory tuberculosis: Secondary | ICD-10-CM | POA: Diagnosis not present

## 2023-12-09 DIAGNOSIS — L732 Hidradenitis suppurativa: Secondary | ICD-10-CM | POA: Diagnosis not present

## 2023-12-10 DIAGNOSIS — E282 Polycystic ovarian syndrome: Secondary | ICD-10-CM | POA: Diagnosis not present

## 2023-12-10 DIAGNOSIS — F419 Anxiety disorder, unspecified: Secondary | ICD-10-CM | POA: Diagnosis not present

## 2023-12-10 DIAGNOSIS — R635 Abnormal weight gain: Secondary | ICD-10-CM | POA: Diagnosis not present

## 2023-12-10 DIAGNOSIS — R42 Dizziness and giddiness: Secondary | ICD-10-CM | POA: Diagnosis not present

## 2023-12-10 DIAGNOSIS — J45909 Unspecified asthma, uncomplicated: Secondary | ICD-10-CM | POA: Diagnosis not present

## 2023-12-10 DIAGNOSIS — L732 Hidradenitis suppurativa: Secondary | ICD-10-CM | POA: Diagnosis not present

## 2023-12-18 ENCOUNTER — Ambulatory Visit (HOSPITAL_COMMUNITY): Admitting: Clinical

## 2023-12-18 DIAGNOSIS — F419 Anxiety disorder, unspecified: Secondary | ICD-10-CM

## 2023-12-18 DIAGNOSIS — F331 Major depressive disorder, recurrent, moderate: Secondary | ICD-10-CM

## 2023-12-18 DIAGNOSIS — F1210 Cannabis abuse, uncomplicated: Secondary | ICD-10-CM

## 2023-12-18 DIAGNOSIS — F4310 Post-traumatic stress disorder, unspecified: Secondary | ICD-10-CM | POA: Diagnosis not present

## 2023-12-18 NOTE — Progress Notes (Signed)
 Virtual Visit via Video Note   I connected with Chelsea Wade on 12/18/23 at  2:00 PM EDT by a video enabled telemedicine application and verified that I am speaking with the correct person using two identifiers.   Location: Patient: Home Provider: Office   I discussed the limitations of evaluation and management by telemedicine and the availability of in person appointments. The patient expressed understanding and agreed to proceed.   THERAPIST PROGRESS NOTE   Session Time: 2:00 PM-2:30 PM   Participation Level: Active   Behavioral Response: CasualAlertDepressed   Type of Therapy: Individual Therapy   Treatment Goals addressed: Mood and Coping   Interventions: CBT, Motivational Interviewing, Solution Focused and Strength-based   Summary: Chelsea Wade is a 23 y.o. female who presents with Depression. The OPT therapist worked with the patient for her ongoing OPT treatment. The OPT therapist utilized Motivational Interviewing to assist in creating therapeutic repore. The patient in the session was engaged and work in collaboration giving feedback about her triggers and symptoms over the past few weeks. 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 The patient noted that she actually decided to leave the job just before the patient she was caring for passed away. The patient has been busy with college classes overview the past few weeks.The patient spoke about the pool at the complex she lives in has still not opened so she has not been able to take advantage of going to the pool, but hopes this will soon be fixed. Additionally the patient spoke about awareness of not being in the extreme heat for long periods of time and staying hydrated. The patient learned her PCP will be leaving and her plan is to follow her to the new practice. The patient is  working with the insurance company to get coverage for dermatology medication and has decided to hold off on starting [employer]gov[employer]. The OPT therapist overviewed upcoming appointments as listed in the patients MyChart.   Suicidal/Homicidal: Nowithout intent/plan   Therapist Response: The OPT therapist worked with the patient for the patients scheduled session. The patient was engaged in her session and gave feedback in relation to triggers, symptoms, and behavior responses over the past few weeks. 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 The patient spoke about involvement in her college classes. The patient spoke about deciding to put off starting [facility] and instead opting to get insurance approval for a dermatology injection medication. Additionally the patient shared she has lost around 3lbs naturally.. The OPT therapist worked with the patient utilizing an in session Cognitive Behavioral Therapy exercise. The patient was responsive in the session and verbalized, I still working on getting out and not just benge watching tv shows but with the heat I am definitely getting out early or after the sun sets.The OPT therapist worked with the patient on her basic needs areas including sleep, eating habits, exercise, and hygiene. The OPT therapist worked with the patient on management of stress/ anxiety with coping skills and using her support system. The patient spoke about looking forward to the pool opening and incorporating this into her exercise routine. The OPT therapist encouraged ongoing work on smoking cessation for health benefits. The patient spoke about her PCP will be leaving  and her plan is to follow her to the new practice   Plan: Return again in 2/3 weeks.   Diagnosis:      Axis I: Recurrent moderate,major depression with Anxiety, PTSD, Cannabis Use Disorder                            Axis II: No diagnosis   Collaboration of Care: Overview of patient involvement with Dr. Okey in the med therapy program    Patient/Guardian was advised Release of Information must be obtained prior to any record release in order to collaborate their care with an outside provider. Patient/Guardian was advised if they have not already done so to contact the registration department to sign all necessary forms in order for us  to release information regarding their care.    Consent: Patient/Guardian gives verbal consent for treatment and assignment of benefits for services provided during this visit. Patient/Guardian expressed understanding and agreed to proceed.      I discussed the assessment and treatment plan with the patient. The patient was provided an opportunity to ask questions and all were answered. The patient agreed with the plan and demonstrated an understanding of the instructions.   The patient was advised to call back or seek an in-person evaluation if the symptoms worsen or if the condition fails to improve as anticipated.   I provided 30 minutes of non-face-to-face time during this encounter.   Jerel Pepper, LCSW   12/18/2023

## 2024-01-03 DIAGNOSIS — Z419 Encounter for procedure for purposes other than remedying health state, unspecified: Secondary | ICD-10-CM | POA: Diagnosis not present

## 2024-01-12 DIAGNOSIS — L732 Hidradenitis suppurativa: Secondary | ICD-10-CM | POA: Diagnosis not present

## 2024-01-14 DIAGNOSIS — H60331 Swimmer's ear, right ear: Secondary | ICD-10-CM | POA: Diagnosis not present

## 2024-02-03 DIAGNOSIS — Z419 Encounter for procedure for purposes other than remedying health state, unspecified: Secondary | ICD-10-CM | POA: Diagnosis not present

## 2024-02-13 DIAGNOSIS — L732 Hidradenitis suppurativa: Secondary | ICD-10-CM | POA: Diagnosis not present

## 2024-02-13 DIAGNOSIS — L237 Allergic contact dermatitis due to plants, except food: Secondary | ICD-10-CM | POA: Diagnosis not present

## 2024-03-05 DIAGNOSIS — Z419 Encounter for procedure for purposes other than remedying health state, unspecified: Secondary | ICD-10-CM | POA: Diagnosis not present

## 2024-03-18 DIAGNOSIS — L0591 Pilonidal cyst without abscess: Secondary | ICD-10-CM | POA: Diagnosis not present

## 2024-04-04 DIAGNOSIS — H6691 Otitis media, unspecified, right ear: Secondary | ICD-10-CM | POA: Diagnosis not present

## 2024-04-04 DIAGNOSIS — R070 Pain in throat: Secondary | ICD-10-CM | POA: Diagnosis not present

## 2024-04-04 DIAGNOSIS — R059 Cough, unspecified: Secondary | ICD-10-CM | POA: Diagnosis not present

## 2024-04-07 DIAGNOSIS — H6691 Otitis media, unspecified, right ear: Secondary | ICD-10-CM | POA: Diagnosis not present

## 2024-04-07 DIAGNOSIS — J209 Acute bronchitis, unspecified: Secondary | ICD-10-CM | POA: Diagnosis not present

## 2024-05-05 DIAGNOSIS — Z419 Encounter for procedure for purposes other than remedying health state, unspecified: Secondary | ICD-10-CM | POA: Diagnosis not present

## 2024-05-11 DIAGNOSIS — Z7689 Persons encountering health services in other specified circumstances: Secondary | ICD-10-CM | POA: Diagnosis not present

## 2024-05-11 DIAGNOSIS — F411 Generalized anxiety disorder: Secondary | ICD-10-CM | POA: Diagnosis not present

## 2024-05-11 DIAGNOSIS — H6593 Unspecified nonsuppurative otitis media, bilateral: Secondary | ICD-10-CM | POA: Diagnosis not present

## 2024-05-11 DIAGNOSIS — F322 Major depressive disorder, single episode, severe without psychotic features: Secondary | ICD-10-CM | POA: Diagnosis not present

## 2024-05-11 DIAGNOSIS — L0591 Pilonidal cyst without abscess: Secondary | ICD-10-CM | POA: Diagnosis not present

## 2024-06-04 DIAGNOSIS — Z419 Encounter for procedure for purposes other than remedying health state, unspecified: Secondary | ICD-10-CM | POA: Diagnosis not present

## 2024-06-10 DIAGNOSIS — R300 Dysuria: Secondary | ICD-10-CM | POA: Diagnosis not present

## 2024-06-10 DIAGNOSIS — R5383 Other fatigue: Secondary | ICD-10-CM | POA: Diagnosis not present

## 2024-06-10 DIAGNOSIS — R252 Cramp and spasm: Secondary | ICD-10-CM | POA: Diagnosis not present

## 2024-06-10 DIAGNOSIS — Z23 Encounter for immunization: Secondary | ICD-10-CM | POA: Diagnosis not present

## 2024-06-10 DIAGNOSIS — R739 Hyperglycemia, unspecified: Secondary | ICD-10-CM | POA: Diagnosis not present

## 2024-06-10 DIAGNOSIS — F411 Generalized anxiety disorder: Secondary | ICD-10-CM | POA: Diagnosis not present

## 2024-06-10 DIAGNOSIS — R079 Chest pain, unspecified: Secondary | ICD-10-CM | POA: Diagnosis not present

## 2024-07-26 ENCOUNTER — Emergency Department

## 2024-07-26 ENCOUNTER — Other Ambulatory Visit: Payer: Self-pay

## 2024-07-26 ENCOUNTER — Emergency Department
Admission: EM | Admit: 2024-07-26 | Discharge: 2024-07-26 | Disposition: A | Discharge status: Home / Self Care | Attending: Emergency Medicine | Admitting: Emergency Medicine

## 2024-07-26 ENCOUNTER — Encounter: Payer: Self-pay | Admitting: Emergency Medicine

## 2024-07-26 DIAGNOSIS — R10A2 Flank pain, left side: Secondary | ICD-10-CM

## 2024-07-26 DIAGNOSIS — E119 Type 2 diabetes mellitus without complications: Secondary | ICD-10-CM | POA: Insufficient documentation

## 2024-07-26 DIAGNOSIS — R319 Hematuria, unspecified: Secondary | ICD-10-CM | POA: Insufficient documentation

## 2024-07-26 DIAGNOSIS — R1012 Left upper quadrant pain: Secondary | ICD-10-CM | POA: Insufficient documentation

## 2024-07-26 HISTORY — DX: Hidradenitis suppurativa: L73.2

## 2024-07-26 HISTORY — DX: Type 2 diabetes mellitus without complications: E11.9

## 2024-07-26 LAB — URINALYSIS, ROUTINE W REFLEX MICROSCOPIC
Bilirubin Urine: NEGATIVE
Glucose, UA: NEGATIVE mg/dL
Hgb urine dipstick: NEGATIVE
Ketones, ur: NEGATIVE mg/dL
Nitrite: NEGATIVE
Protein, ur: NEGATIVE mg/dL
Specific Gravity, Urine: 1.013 (ref 1.005–1.030)
pH: 7 (ref 5.0–8.0)

## 2024-07-26 LAB — BASIC METABOLIC PANEL WITH GFR
Anion gap: 10 (ref 5–15)
BUN: 10 mg/dL (ref 6–20)
CO2: 24 mmol/L (ref 22–32)
Calcium: 9.1 mg/dL (ref 8.9–10.3)
Chloride: 103 mmol/L (ref 98–111)
Creatinine, Ser: 0.65 mg/dL (ref 0.44–1.00)
GFR, Estimated: >60 mL/min
Glucose, Bld: 126 mg/dL — ABNORMAL HIGH (ref 70–99)
Potassium: 4.2 mmol/L (ref 3.5–5.1)
Sodium: 137 mmol/L (ref 135–145)

## 2024-07-26 LAB — CBC
HCT: 39.4 % (ref 36.0–46.0)
Hemoglobin: 13 g/dL (ref 12.0–15.0)
MCH: 29.8 pg (ref 26.0–34.0)
MCHC: 33 g/dL (ref 30.0–36.0)
MCV: 90.4 fL (ref 80.0–100.0)
Platelets: 286 10*3/uL (ref 150–400)
RBC: 4.36 MIL/uL (ref 3.87–5.11)
RDW: 13 % (ref 11.5–15.5)
WBC: 8.9 10*3/uL (ref 4.0–10.5)
nRBC: 0 % (ref 0.0–0.2)

## 2024-07-26 LAB — POC URINE PREG, ED: Preg Test, Ur: NEGATIVE

## 2024-07-26 NOTE — ED Notes (Signed)
 See triage note  Presents with some blood in urine  States has been treated for UTI recently  Afebrile on arrival

## 2024-07-27 LAB — URINE CULTURE
